# Patient Record
Sex: Male | Born: 1960 | Race: White | Hispanic: No | Marital: Married | Smoking: Current every day smoker
Health system: Southern US, Community
[De-identification: ages and names within clinical notes are randomized; demographics above are authoritative.]

## PROBLEM LIST (undated history)

## (undated) DIAGNOSIS — Z789 Other specified health status: Secondary | ICD-10-CM

## (undated) DIAGNOSIS — Z7289 Other problems related to lifestyle: Secondary | ICD-10-CM

## (undated) HISTORY — PX: OTHER SURGICAL HISTORY: SHX169

---

## 2018-06-19 DIAGNOSIS — R29898 Other symptoms and signs involving the musculoskeletal system: Secondary | ICD-10-CM

## 2018-06-19 DIAGNOSIS — E871 Hypo-osmolality and hyponatremia: Secondary | ICD-10-CM

## 2018-06-19 DIAGNOSIS — M545 Low back pain: Secondary | ICD-10-CM

## 2018-06-19 DIAGNOSIS — R296 Repeated falls: Secondary | ICD-10-CM

## 2018-06-19 DIAGNOSIS — F1010 Alcohol abuse, uncomplicated: Secondary | ICD-10-CM

## 2018-06-19 DIAGNOSIS — G629 Polyneuropathy, unspecified: Secondary | ICD-10-CM

## 2018-06-21 ENCOUNTER — Encounter (HOSPITAL_COMMUNITY): Payer: Self-pay | Admitting: Internal Medicine

## 2018-06-21 ENCOUNTER — Inpatient Hospital Stay (HOSPITAL_COMMUNITY): Payer: Medicare Other

## 2018-06-21 ENCOUNTER — Inpatient Hospital Stay (HOSPITAL_COMMUNITY)
Admission: AD | Admit: 2018-06-21 | Discharge: 2018-07-01 | DRG: 472 | Disposition: A | Discharge status: Rehabilitation Facility | Payer: Medicare Other | Source: Other Acute Inpatient Hospital | Attending: Family Medicine | Admitting: Family Medicine

## 2018-06-21 DIAGNOSIS — Z789 Other specified health status: Secondary | ICD-10-CM

## 2018-06-21 DIAGNOSIS — R062 Wheezing: Secondary | ICD-10-CM

## 2018-06-21 DIAGNOSIS — F17210 Nicotine dependence, cigarettes, uncomplicated: Secondary | ICD-10-CM | POA: Diagnosis present

## 2018-06-21 DIAGNOSIS — R000 Tachycardia, unspecified: Secondary | ICD-10-CM | POA: Diagnosis present

## 2018-06-21 DIAGNOSIS — R279 Unspecified lack of coordination: Secondary | ICD-10-CM | POA: Diagnosis present

## 2018-06-21 DIAGNOSIS — M2578 Osteophyte, vertebrae: Secondary | ICD-10-CM | POA: Diagnosis present

## 2018-06-21 DIAGNOSIS — Z9181 History of falling: Secondary | ICD-10-CM

## 2018-06-21 DIAGNOSIS — J449 Chronic obstructive pulmonary disease, unspecified: Secondary | ICD-10-CM | POA: Diagnosis present

## 2018-06-21 DIAGNOSIS — Z419 Encounter for procedure for purposes other than remedying health state, unspecified: Secondary | ICD-10-CM

## 2018-06-21 DIAGNOSIS — M4802 Spinal stenosis, cervical region: Secondary | ICD-10-CM | POA: Diagnosis present

## 2018-06-21 DIAGNOSIS — K5900 Constipation, unspecified: Secondary | ICD-10-CM | POA: Diagnosis not present

## 2018-06-21 DIAGNOSIS — I10 Essential (primary) hypertension: Secondary | ICD-10-CM | POA: Diagnosis present

## 2018-06-21 DIAGNOSIS — G9520 Unspecified cord compression: Secondary | ICD-10-CM | POA: Diagnosis present

## 2018-06-21 DIAGNOSIS — Z4789 Encounter for other orthopedic aftercare: Secondary | ICD-10-CM | POA: Diagnosis not present

## 2018-06-21 DIAGNOSIS — R9082 White matter disease, unspecified: Secondary | ICD-10-CM | POA: Diagnosis present

## 2018-06-21 DIAGNOSIS — F10239 Alcohol dependence with withdrawal, unspecified: Secondary | ICD-10-CM | POA: Diagnosis not present

## 2018-06-21 DIAGNOSIS — G629 Polyneuropathy, unspecified: Secondary | ICD-10-CM | POA: Diagnosis present

## 2018-06-21 DIAGNOSIS — R2689 Other abnormalities of gait and mobility: Secondary | ICD-10-CM | POA: Diagnosis present

## 2018-06-21 DIAGNOSIS — M48061 Spinal stenosis, lumbar region without neurogenic claudication: Secondary | ICD-10-CM | POA: Diagnosis present

## 2018-06-21 DIAGNOSIS — G8190 Hemiplegia, unspecified affecting unspecified side: Secondary | ICD-10-CM | POA: Diagnosis present

## 2018-06-21 DIAGNOSIS — Z1159 Encounter for screening for other viral diseases: Secondary | ICD-10-CM

## 2018-06-21 DIAGNOSIS — Z23 Encounter for immunization: Secondary | ICD-10-CM | POA: Diagnosis present

## 2018-06-21 DIAGNOSIS — E871 Hypo-osmolality and hyponatremia: Secondary | ICD-10-CM | POA: Diagnosis present

## 2018-06-21 DIAGNOSIS — M5030 Other cervical disc degeneration, unspecified cervical region: Principal | ICD-10-CM | POA: Diagnosis present

## 2018-06-21 DIAGNOSIS — Z720 Tobacco use: Secondary | ICD-10-CM

## 2018-06-21 DIAGNOSIS — F1090 Alcohol use, unspecified, uncomplicated: Secondary | ICD-10-CM

## 2018-06-21 DIAGNOSIS — Z7289 Other problems related to lifestyle: Secondary | ICD-10-CM

## 2018-06-21 HISTORY — DX: Other specified health status: Z78.9

## 2018-06-21 HISTORY — DX: Other problems related to lifestyle: Z72.89

## 2018-06-21 LAB — CBC
HCT: 33.4 % — ABNORMAL LOW (ref 39.0–52.0)
Hemoglobin: 12 g/dL — ABNORMAL LOW (ref 13.0–17.0)
MCH: 36 pg — ABNORMAL HIGH (ref 26.0–34.0)
MCHC: 35.9 g/dL (ref 30.0–36.0)
MCV: 100.3 fL — ABNORMAL HIGH (ref 80.0–100.0)
Platelets: 271 10*3/uL (ref 150–400)
RBC: 3.33 MIL/uL — ABNORMAL LOW (ref 4.22–5.81)
RDW: 15.3 % (ref 11.5–15.5)
WBC: 10.7 10*3/uL — ABNORMAL HIGH (ref 4.0–10.5)
nRBC: 0 % (ref 0.0–0.2)

## 2018-06-21 LAB — BASIC METABOLIC PANEL
Anion gap: 9 (ref 5–15)
BUN: 7 mg/dL (ref 6–20)
CO2: 23 mmol/L (ref 22–32)
Calcium: 8.5 mg/dL — ABNORMAL LOW (ref 8.9–10.3)
Chloride: 101 mmol/L (ref 98–111)
Creatinine, Ser: 0.7 mg/dL (ref 0.61–1.24)
GFR calc Af Amer: >60 mL/min (ref >60)
GFR calc non Af Amer: >60 mL/min (ref >60)
Glucose, Bld: 116 mg/dL — ABNORMAL HIGH (ref 70–99)
Potassium: 3.4 mmol/L — ABNORMAL LOW (ref 3.5–5.1)
Sodium: 133 mmol/L — ABNORMAL LOW (ref 135–145)

## 2018-06-21 LAB — PHOSPHORUS: Phosphorus: 3.3 mg/dL (ref 2.5–4.6)

## 2018-06-21 LAB — MAGNESIUM: Magnesium: 2 mg/dL (ref 1.7–2.4)

## 2018-06-21 MED ORDER — LORAZEPAM 1 MG PO TABS
1.0000 mg | ORAL_TABLET | Freq: Four times a day (QID) | ORAL | Status: AC | PRN
  Administered 2018-06-22 – 2018-06-24 (×4): 1 mg via ORAL
  Filled 2018-06-21 (×4): qty 1

## 2018-06-21 MED ORDER — VITAMIN B-1 100 MG PO TABS
100.0000 mg | ORAL_TABLET | Freq: Every day | ORAL | Status: DC
  Administered 2018-06-21 – 2018-06-29 (×8): 100 mg via ORAL
  Filled 2018-06-21 (×8): qty 1

## 2018-06-21 MED ORDER — NICOTINE 21 MG/24HR TD PT24
21.0000 mg | MEDICATED_PATCH | Freq: Every day | TRANSDERMAL | Status: DC
  Administered 2018-06-21 – 2018-07-01 (×9): 21 mg via TRANSDERMAL
  Filled 2018-06-21 (×10): qty 1

## 2018-06-21 MED ORDER — FOLIC ACID 1 MG PO TABS
1.0000 mg | ORAL_TABLET | Freq: Every day | ORAL | Status: DC
  Administered 2018-06-21 – 2018-06-29 (×8): 1 mg via ORAL
  Filled 2018-06-21 (×8): qty 1

## 2018-06-21 MED ORDER — ACETAMINOPHEN 325 MG PO TABS
650.0000 mg | ORAL_TABLET | Freq: Four times a day (QID) | ORAL | Status: DC | PRN
  Administered 2018-06-22 – 2018-07-01 (×7): 650 mg via ORAL
  Filled 2018-06-21 (×7): qty 2

## 2018-06-21 MED ORDER — THIAMINE HCL 100 MG/ML IJ SOLN: 100.0000 mg | Freq: Every day | INTRAMUSCULAR | Status: DC

## 2018-06-21 MED ORDER — ADULT MULTIVITAMIN W/MINERALS CH
1.0000 | ORAL_TABLET | Freq: Every day | ORAL | Status: DC
  Administered 2018-06-21 – 2018-06-29 (×8): 1 via ORAL
  Filled 2018-06-21 (×8): qty 1

## 2018-06-21 MED ORDER — ONDANSETRON HCL 4 MG PO TABS: 4.0000 mg | ORAL_TABLET | Freq: Four times a day (QID) | ORAL | Status: DC | PRN

## 2018-06-21 MED ORDER — ACETAMINOPHEN 650 MG RE SUPP: 650.0000 mg | Freq: Four times a day (QID) | RECTAL | Status: DC | PRN

## 2018-06-21 MED ORDER — ONDANSETRON HCL 4 MG/2ML IJ SOLN: 4.0000 mg | Freq: Four times a day (QID) | INTRAMUSCULAR | Status: DC | PRN

## 2018-06-21 MED ORDER — ALBUTEROL SULFATE (2.5 MG/3ML) 0.083% IN NEBU
2.5000 mg | INHALATION_SOLUTION | RESPIRATORY_TRACT | Status: DC | PRN
  Administered 2018-06-22: 12:00:00 2.5 mg via RESPIRATORY_TRACT
  Filled 2018-06-21 (×2): qty 3

## 2018-06-21 MED ORDER — LORAZEPAM 2 MG/ML IJ SOLN
1.0000 mg | Freq: Four times a day (QID) | INTRAMUSCULAR | Status: AC | PRN
  Administered 2018-06-21 – 2018-06-24 (×7): 1 mg via INTRAVENOUS
  Filled 2018-06-21 (×7): qty 1
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
## 2018-06-22 LAB — HIV ANTIBODY (ROUTINE TESTING W REFLEX): HIV Screen 4th Generation wRfx: NONREACTIVE

## 2018-06-22 LAB — BASIC METABOLIC PANEL
Anion gap: 8 (ref 5–15)
BUN: 6 mg/dL (ref 6–20)
CO2: 25 mmol/L (ref 22–32)
Calcium: 8.7 mg/dL — ABNORMAL LOW (ref 8.9–10.3)
Chloride: 102 mmol/L (ref 98–111)
Creatinine, Ser: 0.79 mg/dL (ref 0.61–1.24)
GFR calc Af Amer: >60 mL/min (ref >60)
GFR calc non Af Amer: >60 mL/min (ref >60)
Glucose, Bld: 115 mg/dL — ABNORMAL HIGH (ref 70–99)
Potassium: 3.6 mmol/L (ref 3.5–5.1)
Sodium: 135 mmol/L (ref 135–145)

## 2018-06-22 LAB — CBC
HCT: 35.3 % — ABNORMAL LOW (ref 39.0–52.0)
Hemoglobin: 12.4 g/dL — ABNORMAL LOW (ref 13.0–17.0)
MCH: 35.7 pg — ABNORMAL HIGH (ref 26.0–34.0)
MCHC: 35.1 g/dL (ref 30.0–36.0)
MCV: 101.7 fL — ABNORMAL HIGH (ref 80.0–100.0)
Platelets: 273 10*3/uL (ref 150–400)
RBC: 3.47 MIL/uL — ABNORMAL LOW (ref 4.22–5.81)
RDW: 15.4 % (ref 11.5–15.5)
WBC: 9 10*3/uL (ref 4.0–10.5)
nRBC: 0 % (ref 0.0–0.2)

## 2018-06-22 LAB — SARS CORONAVIRUS 2: SARS Coronavirus 2: NOT DETECTED

## 2018-06-22 MED ORDER — POTASSIUM CHLORIDE 20 MEQ/15ML (10%) PO SOLN
40.0000 meq | Freq: Once | ORAL | Status: AC
  Administered 2018-06-22: 40 meq via ORAL
  Filled 2018-06-22: qty 30

## 2018-06-22 NOTE — Plan of Care (Signed)
  Problem: Elimination: Goal: Will not experience complications related to bowel motility Outcome: Progressing   
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
## 2018-06-23 NOTE — Plan of Care (Signed)
  Problem: Nutrition: Goal: Adequate nutrition will be maintained Outcome: Progressing   

## 2018-06-23 NOTE — Progress Notes (Signed)
  NEUROSURGERY PROGRESS NOTE   No issues overnight.  Remains somnolent  EXAM:  BP (!) 186/93 (BP Location: Right Arm)   Pulse 93   Temp 97.7 F (36.5 C) (Axillary)   Resp 17   SpO2 98%   somnolent difficult to arouse secondary to sedation  IMPRESSION/PLAN 58 y.o. male with mod-severe C5-6 spinal stenosis needing decompression. Remains somnolent due to sedating meds. Will await recovery from w/d prior to surgery. Hopefully later this week.
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
## 2018-06-24 LAB — COMPREHENSIVE METABOLIC PANEL
ALT: 38 U/L (ref 0–44)
AST: 28 U/L (ref 15–41)
Albumin: 3.2 g/dL — ABNORMAL LOW (ref 3.5–5.0)
Alkaline Phosphatase: 56 U/L (ref 38–126)
Anion gap: 10 (ref 5–15)
BUN: 6 mg/dL (ref 6–20)
CO2: 33 mmol/L — ABNORMAL HIGH (ref 22–32)
Calcium: 8.8 mg/dL — ABNORMAL LOW (ref 8.9–10.3)
Chloride: 93 mmol/L — ABNORMAL LOW (ref 98–111)
Creatinine, Ser: 0.73 mg/dL (ref 0.61–1.24)
GFR calc Af Amer: >60 mL/min (ref >60)
GFR calc non Af Amer: >60 mL/min (ref >60)
Glucose, Bld: 93 mg/dL (ref 70–99)
Potassium: 2.8 mmol/L — ABNORMAL LOW (ref 3.5–5.1)
Sodium: 136 mmol/L (ref 135–145)
Total Bilirubin: 1 mg/dL (ref 0.3–1.2)
Total Protein: 5.6 g/dL — ABNORMAL LOW (ref 6.5–8.1)

## 2018-06-24 LAB — CBC WITH DIFFERENTIAL/PLATELET
Abs Immature Granulocytes: 0.08 10*3/uL — ABNORMAL HIGH (ref 0.00–0.07)
Basophils Absolute: 0 10*3/uL (ref 0.0–0.1)
Basophils Relative: 0 %
Eosinophils Absolute: 0.1 10*3/uL (ref 0.0–0.5)
Eosinophils Relative: 1 %
HCT: 37.5 % — ABNORMAL LOW (ref 39.0–52.0)
Hemoglobin: 13.7 g/dL (ref 13.0–17.0)
Immature Granulocytes: 1 %
Lymphocytes Relative: 10 %
Lymphs Abs: 0.9 10*3/uL (ref 0.7–4.0)
MCH: 36.1 pg — ABNORMAL HIGH (ref 26.0–34.0)
MCHC: 36.5 g/dL — ABNORMAL HIGH (ref 30.0–36.0)
MCV: 98.7 fL (ref 80.0–100.0)
Monocytes Absolute: 0.7 10*3/uL (ref 0.1–1.0)
Monocytes Relative: 8 %
Neutro Abs: 7.5 10*3/uL (ref 1.7–7.7)
Neutrophils Relative %: 80 %
Platelets: 200 10*3/uL (ref 150–400)
RBC: 3.8 MIL/uL — ABNORMAL LOW (ref 4.22–5.81)
RDW: 14.9 % (ref 11.5–15.5)
WBC: 9.3 10*3/uL (ref 4.0–10.5)
nRBC: 0 % (ref 0.0–0.2)

## 2018-06-24 LAB — AMMONIA: Ammonia: 20 umol/L (ref 9–35)

## 2018-06-24 MED ORDER — POTASSIUM CHLORIDE CRYS ER 20 MEQ PO TBCR
40.0000 meq | EXTENDED_RELEASE_TABLET | Freq: Four times a day (QID) | ORAL | Status: AC
  Administered 2018-06-24 (×2): 40 meq via ORAL
  Filled 2018-06-24 (×2): qty 2
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
## 2018-06-24 NOTE — Care Management Important Message (Signed)
Important Message  Patient Details  Name: Curtis Bowen MRN: [id number] Date of Birth: 11-25-1960   Medicare Important Message Given:  Yes    Chandan Fly Montine Circle 06/24/2018, 1:39 PM

## 2018-06-24 NOTE — Progress Notes (Signed)
  NEUROSURGERY PROGRESS NOTE   No issues overnight.  More awake, but confused  EXAM:  BP (!) 169/82 (BP Location: Right Arm)   Pulse 80   Temp 97.8 F (36.6 C) (Oral)   Resp 18   SpO2 99%   Awake, alert Oriented to self not location/year/situation Intermittently follows commands Does wiggle b/l feet to command Unable to perform strength testing   PLAN More alert but remains confused. Awaiting recovery from w/d prior to surgery.

## 2018-06-24 NOTE — Plan of Care (Signed)
  Problem: Education: Goal: Knowledge of General Education information will improve Description: Including pain rating scale, medication(s)/side effects and non-pharmacologic comfort measures Outcome: Progressing   Problem: Health Behavior/Discharge Planning: Goal: Ability to manage health-related needs will improve Outcome: Progressing   Problem: Clinical Measurements: Goal: Ability to maintain clinical measurements within normal limits will improve Outcome: Progressing Goal: Will remain free from infection Outcome: Progressing Goal: Diagnostic test results will improve Outcome: Progressing Goal: Respiratory complications will improve Outcome: Progressing Goal: Cardiovascular complication will be avoided Outcome: Progressing   Problem: Activity: Goal: Risk for activity intolerance will decrease Outcome: Progressing   Problem: Nutrition: Goal: Adequate nutrition will be maintained Outcome: Progressing   Problem: Coping: Goal: Level of anxiety will decrease Outcome: Progressing   Problem: Elimination: Goal: Will not experience complications related to bowel motility Outcome: Progressing Goal: Will not experience complications related to urinary retention Outcome: Progressing   Problem: Pain Managment: Goal: General experience of comfort will improve Outcome: Progressing   Problem: Safety: Goal: Ability to remain free from injury will improve Outcome: Progressing   Problem: Skin Integrity: Goal: Risk for impaired skin integrity will decrease Outcome: Progressing   Problem: Education: Goal: Knowledge of disease or condition will improve Outcome: Progressing Goal: Understanding of discharge needs will improve Outcome: Progressing   Problem: Health Behavior/Discharge Planning: Goal: Ability to identify changes in lifestyle to reduce recurrence of condition will improve Outcome: Progressing Goal: Identification of resources available to assist in meeting health  care needs will improve Outcome: Progressing   Problem: Physical Regulation: Goal: Complications related to the disease process, condition or treatment will be avoided or minimized Outcome: Progressing   Problem: Safety: Goal: Ability to remain free from injury will improve Outcome: Progressing   Wes Lezotte, BSN, RN 

## 2018-06-25 ENCOUNTER — Other Ambulatory Visit: Payer: Self-pay | Admitting: Neurosurgery

## 2018-06-25 ENCOUNTER — Encounter (HOSPITAL_COMMUNITY): Payer: Self-pay | Admitting: Anesthesiology

## 2018-06-25 LAB — BASIC METABOLIC PANEL
Anion gap: 11 (ref 5–15)
BUN: 7 mg/dL (ref 6–20)
CO2: 29 mmol/L (ref 22–32)
Calcium: 8.5 mg/dL — ABNORMAL LOW (ref 8.9–10.3)
Chloride: 96 mmol/L — ABNORMAL LOW (ref 98–111)
Creatinine, Ser: 0.68 mg/dL (ref 0.61–1.24)
GFR calc Af Amer: >60 mL/min (ref >60)
GFR calc non Af Amer: >60 mL/min (ref >60)
Glucose, Bld: 96 mg/dL (ref 70–99)
Potassium: 3.5 mmol/L (ref 3.5–5.1)
Sodium: 136 mmol/L (ref 135–145)

## 2018-06-25 LAB — TYPE AND SCREEN
ABO/RH(D): AB NEG
Antibody Screen: NEGATIVE

## 2018-06-25 LAB — ABO/RH: ABO/RH(D): AB NEG

## 2018-06-25 MED ORDER — HYDRALAZINE HCL 25 MG PO TABS
25.0000 mg | ORAL_TABLET | Freq: Four times a day (QID) | ORAL | Status: DC
  Administered 2018-06-25 – 2018-07-01 (×22): 25 mg via ORAL
  Filled 2018-06-25 (×21): qty 1

## 2018-06-25 MED ORDER — METOPROLOL TARTRATE 25 MG PO TABS
25.0000 mg | ORAL_TABLET | Freq: Two times a day (BID) | ORAL | Status: DC
  Administered 2018-06-25 – 2018-07-01 (×12): 25 mg via ORAL
  Filled 2018-06-25 (×12): qty 1

## 2018-06-25 NOTE — Anesthesia Preprocedure Evaluation (Addendum)
Anesthesia Evaluation  Patient identified by MRN, date of birth, ID band Patient awake    Reviewed: Allergy & Precautions, NPO status , Patient's Chart, lab work & pertinent test results  Airway Mallampati: I       Dental no notable dental hx. (+) Teeth Intact   Pulmonary Current Smoker,    Pulmonary exam normal breath sounds clear to auscultation       Cardiovascular negative cardio ROS Normal cardiovascular exam Rhythm:Regular Rate:Normal     Neuro/Psych    GI/Hepatic negative GI ROS, Neg liver ROS,   Endo/Other  negative endocrine ROS  Renal/GU negative Renal ROS     Musculoskeletal negative musculoskeletal ROS (+)   Abdominal Normal abdominal exam  (+)   Peds  Hematology negative hematology ROS (+)   Anesthesia Other Findings   Reproductive/Obstetrics                            Anesthesia Physical Anesthesia Plan  ASA: II  Anesthesia Plan: General   Post-op Pain Management:    Induction: Intravenous  PONV Risk Score and Plan: 2 and Ondansetron and Dexamethasone  Airway Management Planned: Oral ETT  Additional Equipment:   Intra-op Plan:   Post-operative Plan: Extubation in OR  Informed Consent: I have reviewed the patients History and Physical, chart, labs and discussed the procedure including the risks, benefits and alternatives for the proposed anesthesia with the patient or authorized representative who has indicated his/her understanding and acceptance.     Dental advisory given  Plan Discussed with: CRNA  Anesthesia Plan Comments:        Anesthesia Quick Evaluation

## 2018-06-25 NOTE — TOC Initial Note (Signed)
Transition of Care [facility]) - Initial/Assessment Note    Patient Details  Name: Curtis Bowen MRN: [id number] Date of Birth: October 07, 1960  [facility]) CM/SW Contact:    Pollie Friar, RN Phone Number: 06/25/2018, 1:43 PM  Clinical Narrative:                 PCP: Dr Micheal Likens  Pt on CIWA and waiting to have cervical surgery. Will need PT/OT to see post op for needs.  TOC following.  Expected Discharge Plan: Home/Self Care Barriers to Discharge: Continued Medical Work up   Patient Goals and CMS Choice        Expected Discharge Plan and Services Expected Discharge Plan: Home/Self Care   Discharge Planning Services: CM Consult   Living arrangements for the past 2 months: Mobile Home(about 15 steps to enter)                                      Prior Living Arrangements/Services Living arrangements for the past 2 months: Mobile Home(about 15 steps to enter) Lives with:: Spouse Patient language and need for interpreter reviewed:: Yes(no needs) Do you feel safe going back to the place where you live?: Yes        Care giver support system in place?: (pts wife works but he states she may be able to take time off if needed) Current home services: DME(cane, walker, shower seat) Criminal Activity/Legal Involvement Pertinent to Current Situation/Hospitalization: No - Comment as needed  Activities of Daily Living      Permission Sought/Granted                  Emotional Assessment Appearance:: Appears older than stated age Attitude/Demeanor/Rapport: Engaged Affect (typically observed): Accepting Orientation: : Oriented to Self, Oriented to Place   Psych Involvement: No (comment)  Admission diagnosis:  Severe Cervical Stinosis Patient Active Problem List   Diagnosis Date Noted  . Cervical spinal stenosis 06/21/2018  . Alcohol use   . Hyponatremia   . Tobacco use   . Wheezing    PCP:  System, Pcp Not In Pharmacy:   [place], [place] [address] Phone: [phone] Fax: [phone]     Social Determinants of Health (SDOH) Interventions    Readmission Risk Interventions No flowsheet data found.

## 2018-06-25 NOTE — Progress Notes (Addendum)
  NEUROSURGERY PROGRESS NOTE   No issues overnight.  No ativan overnight Complains of weakness and N/T in BLE  EXAM:  BP (!) 161/81 (BP Location: Right Arm)   Pulse 86   Temp 98.1 F (36.7 C) (Oral)   Resp 16   SpO2 100%   Awake, alert Oriented to self CN grossly intact  Intermittently follows commands Weakness BLE R>L  PLAN Continues to improve mentally although still mildly confused Will discuss with medicine regarding readiness for surgery

## 2018-06-25 NOTE — Plan of Care (Signed)
  Problem: Education: Goal: Knowledge of General Education information will improve Description: Including pain rating scale, medication(s)/side effects and non-pharmacologic comfort measures Outcome: Progressing   Problem: Health Behavior/Discharge Planning: Goal: Ability to manage health-related needs will improve Outcome: Progressing   Problem: Clinical Measurements: Goal: Ability to maintain clinical measurements within normal limits will improve Outcome: Progressing Goal: Will remain free from infection Outcome: Progressing Goal: Diagnostic test results will improve Outcome: Progressing Goal: Respiratory complications will improve Outcome: Progressing Goal: Cardiovascular complication will be avoided Outcome: Progressing   Problem: Activity: Goal: Risk for activity intolerance will decrease Outcome: Progressing   Problem: Nutrition: Goal: Adequate nutrition will be maintained Outcome: Progressing   Problem: Coping: Goal: Level of anxiety will decrease Outcome: Progressing   Problem: Elimination: Goal: Will not experience complications related to bowel motility Outcome: Progressing Goal: Will not experience complications related to urinary retention Outcome: Progressing   Problem: Pain Managment: Goal: General experience of comfort will improve Outcome: Progressing   Problem: Safety: Goal: Ability to remain free from injury will improve Outcome: Progressing   Problem: Skin Integrity: Goal: Risk for impaired skin integrity will decrease Outcome: Progressing   Problem: Education: Goal: Knowledge of disease or condition will improve Outcome: Progressing Goal: Understanding of discharge needs will improve Outcome: Progressing   Problem: Health Behavior/Discharge Planning: Goal: Ability to identify changes in lifestyle to reduce recurrence of condition will improve Outcome: Progressing Goal: Identification of resources available to assist in meeting health  care needs will improve Outcome: Progressing   Problem: Physical Regulation: Goal: Complications related to the disease process, condition or treatment will be avoided or minimized Outcome: Progressing   Problem: Safety: Goal: Ability to remain free from injury will improve Outcome: Progressing   Curtis Bowen, BSN, RN 
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
## 2018-06-25 NOTE — Plan of Care (Signed)
  Problem: Education: Goal: Knowledge of General Education information will improve Description: Including pain rating scale, medication(s)/side effects and non-pharmacologic comfort measures Outcome: Progressing   Problem: Health Behavior/Discharge Planning: Goal: Ability to manage health-related needs will improve Outcome: Progressing   Problem: Clinical Measurements: Goal: Ability to maintain clinical measurements within normal limits will improve Outcome: Progressing Goal: Will remain free from infection Outcome: Progressing Goal: Diagnostic test results will improve Outcome: Progressing Goal: Respiratory complications will improve Outcome: Progressing Goal: Cardiovascular complication will be avoided Outcome: Progressing   Problem: Activity: Goal: Risk for activity intolerance will decrease Outcome: Progressing   Problem: Nutrition: Goal: Adequate nutrition will be maintained Outcome: Progressing   Problem: Coping: Goal: Level of anxiety will decrease Outcome: Progressing   Problem: Elimination: Goal: Will not experience complications related to bowel motility Outcome: Progressing Goal: Will not experience complications related to urinary retention Outcome: Progressing   Problem: Pain Managment: Goal: General experience of comfort will improve Outcome: Progressing   Problem: Safety: Goal: Ability to remain free from injury will improve Outcome: Progressing   Problem: Skin Integrity: Goal: Risk for impaired skin integrity will decrease Outcome: Progressing   Problem: Education: Goal: Knowledge of disease or condition will improve Outcome: Progressing Goal: Understanding of discharge needs will improve Outcome: Progressing   Problem: Health Behavior/Discharge Planning: Goal: Ability to identify changes in lifestyle to reduce recurrence of condition will improve Outcome: Progressing Goal: Identification of resources available to assist in meeting health  care needs will improve Outcome: Progressing   Problem: Physical Regulation: Goal: Complications related to the disease process, condition or treatment will be avoided or minimized Outcome: Progressing   Problem: Safety: Goal: Ability to remain free from injury will improve Outcome: Progressing   Ival Bible, BSN, RN

## 2018-06-26 ENCOUNTER — Inpatient Hospital Stay (HOSPITAL_COMMUNITY): Payer: Medicare Other

## 2018-06-26 ENCOUNTER — Inpatient Hospital Stay (HOSPITAL_COMMUNITY): Payer: Medicare Other | Admitting: Certified Registered"

## 2018-06-26 ENCOUNTER — Encounter (HOSPITAL_COMMUNITY)
Admission: AD | Disposition: A | Discharge status: Rehabilitation Facility | Payer: Self-pay | Source: Other Acute Inpatient Hospital | Attending: Internal Medicine

## 2018-06-26 HISTORY — PX: ANTERIOR CERVICAL DECOMP/DISCECTOMY FUSION: SHX1161

## 2018-06-26 SURGERY — ANTERIOR CERVICAL DECOMPRESSION/DISCECTOMY FUSION 1 LEVEL
Anesthesia: General | Site: Spine Cervical

## 2018-06-26 MED ORDER — MEPERIDINE HCL 25 MG/ML IJ SOLN: 6.2500 mg | INTRAMUSCULAR | Status: DC | PRN

## 2018-06-26 MED ORDER — FENTANYL CITRATE (PF) 250 MCG/5ML IJ SOLN
INTRAMUSCULAR | Status: DC | PRN
  Administered 2018-06-26: 25 ug via INTRAVENOUS
  Administered 2018-06-26: 100 ug via INTRAVENOUS

## 2018-06-26 MED ORDER — KETOROLAC TROMETHAMINE 30 MG/ML IJ SOLN: 30.0000 mg | Freq: Once | INTRAMUSCULAR | Status: DC | PRN

## 2018-06-26 MED ORDER — DEXAMETHASONE SODIUM PHOSPHATE 10 MG/ML IJ SOLN
INTRAMUSCULAR | Status: DC | PRN
  Administered 2018-06-26: 10 mg via INTRAVENOUS

## 2018-06-26 MED ORDER — LACTATED RINGERS IV SOLN
INTRAVENOUS | Status: DC | PRN
  Administered 2018-06-26 (×2): via INTRAVENOUS

## 2018-06-26 MED ORDER — CHLORHEXIDINE GLUCONATE CLOTH 2 % EX PADS
6.0000 | MEDICATED_PAD | Freq: Once | CUTANEOUS | Status: AC
  Administered 2018-06-26: 6 via TOPICAL

## 2018-06-26 MED ORDER — ROCURONIUM BROMIDE 10 MG/ML (PF) SYRINGE
PREFILLED_SYRINGE | INTRAVENOUS | Status: DC | PRN
  Administered 2018-06-26: 50 mg via INTRAVENOUS

## 2018-06-26 MED ORDER — 0.9 % SODIUM CHLORIDE (POUR BTL) OPTIME
TOPICAL | Status: DC | PRN
  Administered 2018-06-26: 1000 mL

## 2018-06-26 MED ORDER — CEFAZOLIN SODIUM-DEXTROSE 2-4 GM/100ML-% IV SOLN
2.0000 g | INTRAVENOUS | Status: AC
  Administered 2018-06-26: 2 g via INTRAVENOUS
  Filled 2018-06-26 (×2): qty 100

## 2018-06-26 MED ORDER — LIDOCAINE 2% (20 MG/ML) 5 ML SYRINGE
INTRAMUSCULAR | Status: AC
  Filled 2018-06-26: qty 10

## 2018-06-26 MED ORDER — MIDAZOLAM HCL 2 MG/2ML IJ SOLN
INTRAMUSCULAR | Status: AC
  Filled 2018-06-26: qty 2

## 2018-06-26 MED ORDER — SODIUM CHLORIDE 0.9 % IV SOLN
INTRAVENOUS | Status: DC | PRN
  Administered 2018-06-26: 13:00:00 500 mL

## 2018-06-26 MED ORDER — BUPIVACAINE HCL 0.5 % IJ SOLN
INTRAMUSCULAR | Status: DC | PRN
  Administered 2018-06-26: 3.5 mL

## 2018-06-26 MED ORDER — FENTANYL CITRATE (PF) 250 MCG/5ML IJ SOLN
INTRAMUSCULAR | Status: AC
  Filled 2018-06-26: qty 5

## 2018-06-26 MED ORDER — ONDANSETRON HCL 4 MG/2ML IJ SOLN
INTRAMUSCULAR | Status: DC | PRN
  Administered 2018-06-26: 4 mg via INTRAVENOUS

## 2018-06-26 MED ORDER — THROMBIN 5000 UNITS EX SOLR
CUTANEOUS | Status: AC
  Filled 2018-06-26: qty 15000

## 2018-06-26 MED ORDER — HYDROMORPHONE HCL 1 MG/ML IJ SOLN
INTRAMUSCULAR | Status: AC
  Filled 2018-06-26: qty 1

## 2018-06-26 MED ORDER — OXYCODONE-ACETAMINOPHEN 5-325 MG PO TABS
1.0000 | ORAL_TABLET | Freq: Four times a day (QID) | ORAL | Status: AC | PRN
  Administered 2018-06-26 – 2018-06-28 (×2): 1 via ORAL
  Filled 2018-06-26 (×3): qty 1

## 2018-06-26 MED ORDER — BUPIVACAINE HCL (PF) 0.5 % IJ SOLN
INTRAMUSCULAR | Status: AC
  Filled 2018-06-26: qty 30

## 2018-06-26 MED ORDER — SUCCINYLCHOLINE CHLORIDE 200 MG/10ML IV SOSY
PREFILLED_SYRINGE | INTRAVENOUS | Status: AC
  Filled 2018-06-26: qty 20

## 2018-06-26 MED ORDER — SUGAMMADEX SODIUM 200 MG/2ML IV SOLN
INTRAVENOUS | Status: DC | PRN
  Administered 2018-06-26: 200 mg via INTRAVENOUS

## 2018-06-26 MED ORDER — SODIUM CHLORIDE 0.9 % IV SOLN
INTRAVENOUS | Status: DC | PRN
  Administered 2018-06-26: 11:00:00 25 ug/min via INTRAVENOUS

## 2018-06-26 MED ORDER — ROCURONIUM BROMIDE 10 MG/ML (PF) SYRINGE
PREFILLED_SYRINGE | INTRAVENOUS | Status: AC
  Filled 2018-06-26: qty 20

## 2018-06-26 MED ORDER — LIDOCAINE 2% (20 MG/ML) 5 ML SYRINGE
INTRAMUSCULAR | Status: DC | PRN
  Administered 2018-06-26: 100 mg via INTRAVENOUS

## 2018-06-26 MED ORDER — HYDROMORPHONE HCL 1 MG/ML IJ SOLN
0.2500 mg | INTRAMUSCULAR | Status: DC | PRN
  Administered 2018-06-26: 0.5 mg via INTRAVENOUS

## 2018-06-26 MED ORDER — DEXAMETHASONE SODIUM PHOSPHATE 10 MG/ML IJ SOLN
INTRAMUSCULAR | Status: AC
  Filled 2018-06-26: qty 2

## 2018-06-26 MED ORDER — LIDOCAINE-EPINEPHRINE 1 %-1:100000 IJ SOLN
INTRAMUSCULAR | Status: AC
  Filled 2018-06-26: qty 1

## 2018-06-26 MED ORDER — HYDROMORPHONE HCL 1 MG/ML IJ SOLN: 0.2500 mg | INTRAMUSCULAR | Status: DC | PRN

## 2018-06-26 MED ORDER — PHENYLEPHRINE HCL (PRESSORS) 10 MG/ML IV SOLN
INTRAVENOUS | Status: AC
  Filled 2018-06-26: qty 2

## 2018-06-26 MED ORDER — ONDANSETRON HCL 4 MG/2ML IJ SOLN
INTRAMUSCULAR | Status: AC
  Filled 2018-06-26: qty 4

## 2018-06-26 MED ORDER — PROPOFOL 10 MG/ML IV BOLUS
INTRAVENOUS | Status: AC
  Filled 2018-06-26: qty 20

## 2018-06-26 MED ORDER — SUCCINYLCHOLINE CHLORIDE 20 MG/ML IJ SOLN
INTRAMUSCULAR | Status: DC | PRN
  Administered 2018-06-26: 100 mg via INTRAVENOUS

## 2018-06-26 MED ORDER — THROMBIN 5000 UNITS EX SOLR
OROMUCOSAL | Status: DC | PRN
  Administered 2018-06-26: 13:00:00 5 mL via TOPICAL

## 2018-06-26 MED ORDER — PROMETHAZINE HCL 25 MG/ML IJ SOLN: 6.2500 mg | INTRAMUSCULAR | Status: DC | PRN

## 2018-06-26 MED ORDER — LIDOCAINE-EPINEPHRINE 1 %-1:100000 IJ SOLN
INTRAMUSCULAR | Status: DC | PRN
  Administered 2018-06-26: 3.6 mL

## 2018-06-26 MED ORDER — PROPOFOL 10 MG/ML IV BOLUS
INTRAVENOUS | Status: DC | PRN
  Administered 2018-06-26: 140 mg via INTRAVENOUS

## 2018-06-26 MED ORDER — THROMBIN 5000 UNITS EX SOLR
CUTANEOUS | Status: AC
  Filled 2018-06-26: qty 5000

## 2018-06-26 MED ORDER — EPHEDRINE SULFATE-NACL 50-0.9 MG/10ML-% IV SOSY
PREFILLED_SYRINGE | INTRAVENOUS | Status: DC | PRN
  Administered 2018-06-26: 5 mg via INTRAVENOUS

## 2018-06-26 MED ORDER — PHENYLEPHRINE 40 MCG/ML (10ML) SYRINGE FOR IV PUSH (FOR BLOOD PRESSURE SUPPORT)
PREFILLED_SYRINGE | INTRAVENOUS | Status: DC | PRN
  Administered 2018-06-26 (×2): 80 ug via INTRAVENOUS
  Administered 2018-06-26: 120 ug via INTRAVENOUS

## 2018-06-26 SURGICAL SUPPLY — 69 items
BAG DECANTER FOR FLEXI CONT (MISCELLANEOUS) ×3 IMPLANT
BAG URINE DRAINAGE (UROLOGICAL SUPPLIES) ×3 IMPLANT
BENZOIN TINCTURE PRP APPL 2/3 (GAUZE/BANDAGES/DRESSINGS) IMPLANT
BLADE CLIPPER SURG (BLADE) ×3 IMPLANT
BLADE SURG 11 STRL SS (BLADE) ×3 IMPLANT
BLADE ULTRA TIP 2M (BLADE) IMPLANT
BUR MATCHSTICK NEURO 3.0 LAGG (BURR) ×3 IMPLANT
CANISTER SUCT 3000ML PPV (MISCELLANEOUS) ×3 IMPLANT
CARTRIDGE OIL MAESTRO DRILL (MISCELLANEOUS) ×1 IMPLANT
CLOSURE WOUND 1/2 X4 (GAUZE/BANDAGES/DRESSINGS)
COVER WAND RF STERILE (DRAPES) IMPLANT
DECANTER SPIKE VIAL GLASS SM (MISCELLANEOUS) ×3 IMPLANT
DERMABOND ADVANCED (GAUZE/BANDAGES/DRESSINGS) ×2
DERMABOND ADVANCED .7 DNX12 (GAUZE/BANDAGES/DRESSINGS) ×1 IMPLANT
DEVICE ENDSKLTN TC NANOLCK 6MM (Cage) ×1 IMPLANT
DIFFUSER DRILL AIR PNEUMATIC (MISCELLANEOUS) ×3 IMPLANT
DRAPE C-ARM 42X72 X-RAY (DRAPES) ×6 IMPLANT
DRAPE HALF SHEET 40X57 (DRAPES) IMPLANT
DRAPE LAPAROTOMY 100X72 PEDS (DRAPES) ×3 IMPLANT
DRAPE MICROSCOPE LEICA (MISCELLANEOUS) ×3 IMPLANT
DRSG OPSITE 4X5.5 SM (GAUZE/BANDAGES/DRESSINGS) ×6 IMPLANT
DRSG OPSITE POSTOP 4X6 (GAUZE/BANDAGES/DRESSINGS) ×3 IMPLANT
DURAPREP 6ML APPLICATOR 50/CS (WOUND CARE) ×3 IMPLANT
ELECT COATED BLADE 2.86 ST (ELECTRODE) ×6 IMPLANT
ELECT REM PT RETURN 9FT ADLT (ELECTROSURGICAL) ×3
ELECTRODE REM PT RTRN 9FT ADLT (ELECTROSURGICAL) ×1 IMPLANT
ENDOSKELETON TC NANOLOCK 6MM (Cage) ×3 IMPLANT
GAUZE 4X4 16PLY RFD (DISPOSABLE) IMPLANT
GLOVE BIO SURGEON STRL SZ 6.5 (GLOVE) ×6 IMPLANT
GLOVE BIO SURGEON STRL SZ7.5 (GLOVE) ×9 IMPLANT
GLOVE BIO SURGEONS STRL SZ 6.5 (GLOVE) ×3
GLOVE BIOGEL PI IND STRL 6.5 (GLOVE) ×2 IMPLANT
GLOVE BIOGEL PI IND STRL 7.0 (GLOVE) ×1 IMPLANT
GLOVE BIOGEL PI IND STRL 7.5 (GLOVE) ×3 IMPLANT
GLOVE BIOGEL PI INDICATOR 6.5 (GLOVE) ×4
GLOVE BIOGEL PI INDICATOR 7.0 (GLOVE) ×2
GLOVE BIOGEL PI INDICATOR 7.5 (GLOVE) ×6
GLOVE ECLIPSE 7.0 STRL STRAW (GLOVE) ×6 IMPLANT
GLOVE EXAM NITRILE XL STR (GLOVE) IMPLANT
GLOVE SURG SS PI 6.0 STRL IVOR (GLOVE) ×6 IMPLANT
GOWN STRL REUS W/ TWL LRG LVL3 (GOWN DISPOSABLE) ×5 IMPLANT
GOWN STRL REUS W/ TWL XL LVL3 (GOWN DISPOSABLE) IMPLANT
GOWN STRL REUS W/TWL 2XL LVL3 (GOWN DISPOSABLE) IMPLANT
GOWN STRL REUS W/TWL LRG LVL3 (GOWN DISPOSABLE) ×10
GOWN STRL REUS W/TWL XL LVL3 (GOWN DISPOSABLE)
HEMOSTAT POWDER KIT SURGIFOAM (HEMOSTASIS) ×3 IMPLANT
KIT BASIN OR (CUSTOM PROCEDURE TRAY) ×3 IMPLANT
KIT TURNOVER KIT B (KITS) ×3 IMPLANT
MARKER SKIN DUAL TIP RULER LAB (MISCELLANEOUS) ×3 IMPLANT
NEEDLE HYPO 22GX1.5 SAFETY (NEEDLE) ×3 IMPLANT
NEEDLE SPNL 22GX3.5 QUINCKE BK (NEEDLE) ×3 IMPLANT
NS IRRIG 1000ML POUR BTL (IV SOLUTION) ×3 IMPLANT
OIL CARTRIDGE MAESTRO DRILL (MISCELLANEOUS) ×3
PACK LAMINECTOMY NEURO (CUSTOM PROCEDURE TRAY) ×3 IMPLANT
PAD ARMBOARD 7.5X6 YLW CONV (MISCELLANEOUS) ×9 IMPLANT
PLATE ZEVO 1LVL 19MM (Plate) ×3 IMPLANT
PUTTY DBF 1CC CORTICAL FIBERS (Putty) ×3 IMPLANT
RUBBERBAND STERILE (MISCELLANEOUS) ×6 IMPLANT
SCREW 3.5 SELFDRILL 15MM VARI (Screw) ×12 IMPLANT
SPONGE INTESTINAL PEANUT (DISPOSABLE) ×3 IMPLANT
SPONGE SURGIFOAM ABS GEL SZ50 (HEMOSTASIS) IMPLANT
STAPLER VISISTAT 35W (STAPLE) ×3 IMPLANT
STRIP CLOSURE SKIN 1/2X4 (GAUZE/BANDAGES/DRESSINGS) IMPLANT
SUT VIC AB 3-0 SH 8-18 (SUTURE) ×3 IMPLANT
SUT VICRYL 3-0 RB1 18 ABS (SUTURE) ×9 IMPLANT
TAPE CLOTH 3X10 TAN LF (GAUZE/BANDAGES/DRESSINGS) ×3 IMPLANT
TOWEL GREEN STERILE (TOWEL DISPOSABLE) ×3 IMPLANT
TOWEL GREEN STERILE FF (TOWEL DISPOSABLE) ×3 IMPLANT
WATER STERILE IRR 1000ML POUR (IV SOLUTION) ×3 IMPLANT

## 2018-06-26 NOTE — Progress Notes (Signed)
Dr. Kennon Holter informed of pt's need for signed consent for possible surgery 06/26/2018.

## 2018-06-26 NOTE — Anesthesia Procedure Notes (Signed)
Procedure Name: Intubation Date/Time: 06/26/2018 11:01 AM Performed by: Gaylene Brooks, CRNA Pre-anesthesia Checklist: Patient identified, Emergency Drugs available, Suction available and Patient being monitored Patient Re-evaluated:Patient Re-evaluated prior to induction Oxygen Delivery Method: Circle System Utilized Preoxygenation: Pre-oxygenation with 100% oxygen Induction Type: IV induction Laryngoscope Size: Glidescope and 3 Grade View: Grade I Tube type: Oral Tube size: 7.5 mm Number of attempts: 1 Airway Equipment and Method: Stylet and Oral airway Placement Confirmation: ETT inserted through vocal cords under direct vision,  positive ETCO2 and breath sounds checked- equal and bilateral Secured at: 23 cm Tube secured with: Tape Dental Injury: Teeth and Oropharynx as per pre-operative assessment  Comments: Glidescope used at surgeon request.  Neck maintained in neutral position during induction and intubation.

## 2018-06-26 NOTE — Progress Notes (Signed)
  NEUROSURGERY PROGRESS NOTE   No issues overnight.   EXAM:  BP (!) 159/75 (BP Location: Right Arm)   Pulse 71   Temp 98.1 F (36.7 C) (Oral)   Resp 16   SpO2 98%   Awake, alert but confused.  Does not remember simple conversations from yesterday or a few hours ago. Speech fluent, appropriate  CN grossly intact Mild diffuse BUE weakness, 4-/5 RLE, 4+/5 LLE  IMPRESSION:  58 y.o. male with diffuse weakness and gait instability likely related to cervical stenosis. Withdrawing from EtOH and is more lucid now but remains confused, not sure if he can fully comprehend consent for surgery although I have reviewed the surgery in detail with him.  PLAN: - I have spoken with the patient's wife (last night) and reviewed the indications for surgery, expected recovery, outcomes, and risks. Specifically, we did discuss the fact that the goal is to halt progression of symptoms and while recovery is possible, certainly not guaranteed. Risks were also reviewed including spinal cord or nerve injury, bleeding, stroke, CSF leak, infection, and need for additional surgeries. All her questions were answered and she agreed to proceed with surgery. - Will proceed with ACDF C5-6

## 2018-06-26 NOTE — Plan of Care (Signed)
Progressing towards goals
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
## 2018-06-26 NOTE — Transfer of Care (Signed)
Immediate Anesthesia Transfer of Care Note  Patient: Curtis Bowen  Procedure(s) Performed: FIVE- CERVICAL SIX (N/A Spine Cervical)  Patient Location: PACU  Anesthesia Type:General  Level of Consciousness: awake and drowsy  Airway & Oxygen Therapy: Patient Spontanous Breathing and Patient connected to face mask oxygen  Post-op Assessment: Report given to RN, Post -op Vital signs reviewed and stable and Patient moving all extremities X 4  Post vital signs: Reviewed and stable  Last Vitals:  Vitals Value Taken Time  BP 143/71 06/26/18 1309  Temp    Pulse 79 06/26/18 1310  Resp 20 06/26/18 1310  SpO2 100 % 06/26/18 1310  Vitals shown include unvalidated device data.  Last Pain:  Vitals:   06/26/18 0408  TempSrc:   PainSc: Asleep      Patients Stated Pain Goal: 0 (75/64/33 2951)  Complications: No apparent anesthesia complications

## 2018-06-26 NOTE — Op Note (Signed)
NEUROSURGERY OPERATIVE NOTE   PREOP DIAGNOSIS: Cervical stenosis with myelopathy, C5-6  POSTOP DIAGNOSIS: Same  PROCEDURE: 1. Discectomy at C5-6 for decompression of spinal cord and exiting nerve roots  2. Placement of intervertebral biomechanical device Medtronic Titan 6mm  Lordotic cage 3. Placement of anterior instrumentation consisting of interbody plate and screws - 19mm Medtronic plate, 15mm screws 4. Use of morselized bone allograft  5. Arthrodesis C5-6, anterior interbody technique  6. Use of intraoperative microscope  SURGEON: Dr. Lisbeth RenshawNeelesh Sissy Goetzke, MD  ASSISTANT: Cindra PresumeVincent Costella, PA-C  ANESTHESIA: General Endotracheal  EBL: 50cc  SPECIMENS: None  DRAINS: None  COMPLICATIONS: None immediate  CONDITION: Hemodynamically stable to PACU  HISTORY: Curtis Bowen is a 58 y.o. y.o. male who initially presented to the emergency department after being brought in by his wife with progressive lower extremity weakness and gait instability.  He is also an alcoholic and was suffering from withdrawals.  His outside MRI did demonstrate significant cervical stenosis at C5-6 with spinal cord compression.  Surgical decompression and fusion was therefore indicated.  After the patient had recovered from his alcohol withdrawal, and obtain medical clearance we did speak with the patient's wife regarding indications for surgery, risks benefits and expected postoperative course.  After all their questions were answered informed consent was obtained from the patient's wife.  PROCEDURE IN DETAIL: The patient was brought to the operating room and transferred to the operative table. After induction of general anesthesia, the patient was positioned on the operative table in the supine position with all pressure points meticulously padded. The skin of the neck was then prepped and draped in the usual sterile fashion.  After timeout was conducted, the skin was infiltrated with local anesthetic.  Skin incision was then made sharply and Bovie electrocautery was used to dissect the subcutaneous tissue until the platysma was identified. The platysma was then divided and undermined. The sternocleidomastoid muscle was then identified and, utilizing natural fascial planes in the neck, the prevertebral fascia was identified and the carotid sheath was retracted laterally and the trachea and esophagus retracted medially.  Spinal needle was then introduced into the C6-7 disc space which was confirmed with intraoperative fluoroscopy.  I therefore carried the dissection superiorly to identify the C5-6 disc space and repeat fluoroscopic image was taken.  At this point Bovie electrocautery was used to dissect in the subperiosteal plane and elevate the longus coli muscles bilaterally.  Table mounted retractors were then placed.  The disc space was then incised with a #11 blade scalpel.  Superficial discectomy was completed.  Rongeurs were then used to remove anterior osteophytes on the C5 and C6 vertebral bodies.  The microscope was then draped sterilely and brought into the field, and the remainder of the case was done under the microscope using microdissection technique.  The remainder of the disc was removed with a high-speed drill.  Curettes were used to remove the cartilaginous endplate.  There was a significant amount of posterior osteophytosis primarily from the posterior corner of the C5 vertebral body.  This was drilled down to identify the posterior annulus as well as the posterior longitudinal ligament.  Nerve hook was then placed to elevate the PLL, and the PLL was incised with a #1 thin plate Kerrison.  Kerrison punches were then used to remove the remainder of the osteophytes and the disc.  Once the decompression was completed, I was able to freely pass a small dissector within the ventral epidural space and out the C5-6 foramina indicating good  decompression.  Having completed our decompression,  attention was turned to placement of the intervertebral device. Trial spacers were used to select a 6 mm lordotic graft. This graft was then filled with morcellized allograft, and inserted under fluoroscopy.  After placement of the intervertebral device, the above anterior cervical plate was selected, and placed across the interspace. Using a high-speed drill, the cortex of the cervical vertebral bodies was punctured, and screws inserted in the level above and below. Final fluoroscopic image in lateral projection was taken to confirm good hardware placement.  At this point, after all counts were verified to be correct, meticulous hemostasis was secured using a combination of bipolar electrocautery and passive hemostatics. The platysma muscle was then closed using interrupted 3-0 Vicryl sutures, and the skin was closed with an interrupted 3-0 Vicry subcuticular stitch. Dermabond and sterile dressings were then applied and the drapes removed.  The patient tolerated the procedure well and was extubated in the room and taken to the postanesthesia care unit in stable condition.  At the end of the case all sponge, needle, instrument, and cottonoid counts were correct.

## 2018-06-27 ENCOUNTER — Encounter (HOSPITAL_COMMUNITY): Payer: Self-pay | Admitting: Neurosurgery

## 2018-06-27 LAB — NOVEL CORONAVIRUS, NAA (HOSP ORDER, SEND-OUT TO REF LAB; TAT 18-24 HRS): SARS-CoV-2, NAA: NOT DETECTED

## 2018-06-27 NOTE — Plan of Care (Signed)
Progressing towards goals

## 2018-06-27 NOTE — Care Management Important Message (Signed)
Important Message  Patient Details  Name: Curtis Bowen MRN: [id number] Date of Birth: December 29, 1960   Medicare Important Message Given:  Yes     Curtis Bowen 06/27/2018, 11:22 AM

## 2018-06-27 NOTE — Anesthesia Postprocedure Evaluation (Signed)
Anesthesia Post Note  Patient: Curtis Bowen  Procedure(s) Performed: FIVE- CERVICAL SIX (N/A Spine Cervical)     Patient location during evaluation: PACU Anesthesia Type: General Level of consciousness: sedated Pain management: pain level controlled Vital Signs Assessment: post-procedure vital signs reviewed and stable Respiratory status: spontaneous breathing Cardiovascular status: stable Postop Assessment: no apparent nausea or vomiting Anesthetic complications: no    Last Vitals:  Vitals:   06/27/18 0415 06/27/18 0714  BP: 138/80 126/76  Pulse: 79 80  Resp: 16 18  Temp: 36.7 C 36.9 C  SpO2: 98% 99%    Last Pain:  Vitals:   06/27/18 0800  TempSrc:   PainSc: 3    Pain Goal: Patients Stated Pain Goal: 0 (06/26/18 0307)                 Huston Foley

## 2018-06-27 NOTE — Progress Notes (Signed)
  NEUROSURGERY PROGRESS NOTE   No issues overnight.  Endorses mild neck pain No change in motor exam  EXAM:  BP 132/72 (BP Location: Right Arm)   Pulse 66   Temp 97.8 F (36.6 C) (Oral)   Resp 18   SpO2 99%   Awake, alert, oriented  Speech fluent, appropriate  CN grossly intact  LLE 4+/5, RLE 4-/5 Incision minimal dried blood on bandage. No swelling/fluctuance.  IMPRESSION/PLAN 58 y.o. male  Pod#1 C5-6 ACDF. Doing well. - PT/OT today
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
## 2018-06-28 LAB — COMPREHENSIVE METABOLIC PANEL
ALT: 10 U/L (ref 0–44)
AST: 15 U/L (ref 15–41)
Albumin: 2.7 g/dL — ABNORMAL LOW (ref 3.5–5.0)
Alkaline Phosphatase: 57 U/L (ref 38–126)
Anion gap: 10 (ref 5–15)
BUN: 8 mg/dL (ref 6–20)
CO2: 26 mmol/L (ref 22–32)
Calcium: 8.5 mg/dL — ABNORMAL LOW (ref 8.9–10.3)
Chloride: 99 mmol/L (ref 98–111)
Creatinine, Ser: 0.62 mg/dL (ref 0.61–1.24)
GFR calc Af Amer: >60 mL/min (ref >60)
GFR calc non Af Amer: >60 mL/min (ref >60)
Glucose, Bld: 95 mg/dL (ref 70–99)
Potassium: 3.2 mmol/L — ABNORMAL LOW (ref 3.5–5.1)
Sodium: 135 mmol/L (ref 135–145)
Total Bilirubin: 0.5 mg/dL (ref 0.3–1.2)
Total Protein: 5.1 g/dL — ABNORMAL LOW (ref 6.5–8.1)

## 2018-06-28 LAB — CBC WITH DIFFERENTIAL/PLATELET
Abs Immature Granulocytes: 0.08 10*3/uL — ABNORMAL HIGH (ref 0.00–0.07)
Basophils Absolute: 0 10*3/uL (ref 0.0–0.1)
Basophils Relative: 0 %
Eosinophils Absolute: 0.2 10*3/uL (ref 0.0–0.5)
Eosinophils Relative: 2 %
HCT: 33 % — ABNORMAL LOW (ref 39.0–52.0)
Hemoglobin: 11.6 g/dL — ABNORMAL LOW (ref 13.0–17.0)
Immature Granulocytes: 1 %
Lymphocytes Relative: 11 %
Lymphs Abs: 0.9 10*3/uL (ref 0.7–4.0)
MCH: 35.5 pg — ABNORMAL HIGH (ref 26.0–34.0)
MCHC: 35.2 g/dL (ref 30.0–36.0)
MCV: 100.9 fL — ABNORMAL HIGH (ref 80.0–100.0)
Monocytes Absolute: 1.2 10*3/uL — ABNORMAL HIGH (ref 0.1–1.0)
Monocytes Relative: 15 %
Neutro Abs: 5.7 10*3/uL (ref 1.7–7.7)
Neutrophils Relative %: 71 %
Platelets: 247 10*3/uL (ref 150–400)
RBC: 3.27 MIL/uL — ABNORMAL LOW (ref 4.22–5.81)
RDW: 15.1 % (ref 11.5–15.5)
WBC: 8.1 10*3/uL (ref 4.0–10.5)
nRBC: 0 % (ref 0.0–0.2)

## 2018-06-28 LAB — APTT: aPTT: 29 seconds (ref 24–36)

## 2018-06-28 LAB — PROTIME-INR
INR: 0.9 (ref 0.8–1.2)
Prothrombin Time: 12.4 seconds (ref 11.4–15.2)

## 2018-06-28 LAB — GLUCOSE, CAPILLARY: Glucose-Capillary: 98 mg/dL (ref 70–99)

## 2018-06-28 MED ORDER — MENTHOL 3 MG MT LOZG: 1.0000 | LOZENGE | OROMUCOSAL | Status: DC | PRN

## 2018-06-28 MED ORDER — SODIUM CHLORIDE 0.9% FLUSH: 3.0000 mL | Freq: Two times a day (BID) | INTRAVENOUS | Status: DC

## 2018-06-28 MED ORDER — CEFAZOLIN SODIUM-DEXTROSE 2-4 GM/100ML-% IV SOLN: 2.0000 g | Freq: Three times a day (TID) | INTRAVENOUS | Status: DC

## 2018-06-28 MED ORDER — SODIUM CHLORIDE 0.9 % IV SOLN: INTRAVENOUS | Status: DC

## 2018-06-28 MED ORDER — SODIUM CHLORIDE 0.9% FLUSH: 3.0000 mL | INTRAVENOUS | Status: DC | PRN

## 2018-06-28 MED ORDER — CEFAZOLIN SODIUM-DEXTROSE 2-4 GM/100ML-% IV SOLN
2.0000 g | INTRAVENOUS | Status: DC
  Filled 2018-06-28: qty 100

## 2018-06-28 MED ORDER — PHENOL 1.4 % MT LIQD: 1.0000 | OROMUCOSAL | Status: DC | PRN

## 2018-06-28 MED ORDER — POTASSIUM CHLORIDE 20 MEQ PO PACK
40.0000 meq | PACK | Freq: Once | ORAL | Status: AC
  Administered 2018-06-28: 40 meq via ORAL
  Filled 2018-06-28: qty 2

## 2018-06-28 MED ORDER — SODIUM CHLORIDE 0.9 % IV SOLN: 250.0000 mL | INTRAVENOUS | Status: DC

## 2018-06-28 NOTE — Progress Notes (Signed)
Rehab Admissions Coordinator Note:  Per OT recommendation, this patient was screened by Jhonnie Garner for appropriateness for an Inpatient Acute Rehab Consult.  At this time, we are recommending Inpatient Rehab consult. AC will contact MD to request a consult order.   Jhonnie Garner 06/28/2018, 4:44 PM  I can be reached at [phone].

## 2018-06-28 NOTE — Progress Notes (Signed)
No new issues or problems.  Patient reports that his pain is well controlled.  He is afebrile.  His vital signs are stable.  He is awake and alert.Motor examination stable with bilateral lower extremity weakness.  Wound clean and dry.  Overall stable.  Continue supportive care.  No new recommendations.
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
## 2018-06-29 ENCOUNTER — Other Ambulatory Visit: Payer: Self-pay

## 2018-06-29 LAB — BASIC METABOLIC PANEL
Anion gap: 7 (ref 5–15)
BUN: 6 mg/dL (ref 6–20)
CO2: 25 mmol/L (ref 22–32)
Calcium: 8.5 mg/dL — ABNORMAL LOW (ref 8.9–10.3)
Chloride: 101 mmol/L (ref 98–111)
Creatinine, Ser: 0.54 mg/dL — ABNORMAL LOW (ref 0.61–1.24)
GFR calc Af Amer: >60 mL/min (ref >60)
GFR calc non Af Amer: >60 mL/min (ref >60)
Glucose, Bld: 92 mg/dL (ref 70–99)
Potassium: 3.6 mmol/L (ref 3.5–5.1)
Sodium: 133 mmol/L — ABNORMAL LOW (ref 135–145)

## 2018-06-29 LAB — GLUCOSE, CAPILLARY
Glucose-Capillary: 109 mg/dL — ABNORMAL HIGH (ref 70–99)
Glucose-Capillary: 109 mg/dL — ABNORMAL HIGH (ref 70–99)
Glucose-Capillary: 138 mg/dL — ABNORMAL HIGH (ref 70–99)
Glucose-Capillary: 105 mg/dL — ABNORMAL HIGH (ref 70–99)

## 2018-06-29 MED ORDER — FOLIC ACID 1 MG PO TABS
1.0000 mg | ORAL_TABLET | Freq: Every day | ORAL | Status: DC
  Administered 2018-06-30 – 2018-07-01 (×2): 1 mg via ORAL
  Filled 2018-06-29 (×2): qty 1

## 2018-06-29 MED ORDER — ADULT MULTIVITAMIN W/MINERALS CH
1.0000 | ORAL_TABLET | Freq: Every day | ORAL | Status: DC
  Administered 2018-06-30 – 2018-07-01 (×2): 1 via ORAL
  Filled 2018-06-29 (×2): qty 1

## 2018-06-29 MED ORDER — LORAZEPAM 1 MG PO TABS
0.0000 mg | ORAL_TABLET | Freq: Four times a day (QID) | ORAL | Status: DC
  Administered 2018-06-29 – 2018-06-30 (×3): 2 mg via ORAL
  Administered 2018-06-30: 1 mg via ORAL
  Administered 2018-07-01: 03:00:00 2 mg via ORAL
  Administered 2018-07-01: 1 mg via ORAL
  Filled 2018-06-29: qty 2
  Filled 2018-06-29: qty 1
  Filled 2018-06-29 (×2): qty 2
  Filled 2018-06-29: qty 1
  Filled 2018-06-29 (×2): qty 2

## 2018-06-29 MED ORDER — VITAMIN B-1 100 MG PO TABS
100.0000 mg | ORAL_TABLET | Freq: Every day | ORAL | Status: DC
  Administered 2018-06-30 – 2018-07-01 (×2): 100 mg via ORAL
  Filled 2018-06-29 (×2): qty 1

## 2018-06-29 MED ORDER — THIAMINE HCL 100 MG/ML IJ SOLN: 100.0000 mg | Freq: Every day | INTRAMUSCULAR | Status: DC

## 2018-06-29 MED ORDER — LORAZEPAM 1 MG PO TABS
0.0000 mg | ORAL_TABLET | Freq: Two times a day (BID) | ORAL | Status: DC
  Administered 2018-07-01: 1 mg via ORAL
  Filled 2018-06-29: qty 2

## 2018-06-29 MED ORDER — LORAZEPAM 2 MG/ML IJ SOLN: 1.0000 mg | Freq: Four times a day (QID) | INTRAMUSCULAR | Status: DC | PRN

## 2018-06-29 MED ORDER — LORAZEPAM 1 MG PO TABS
1.0000 mg | ORAL_TABLET | Freq: Four times a day (QID) | ORAL | Status: DC | PRN
  Administered 2018-06-30: 1 mg via ORAL
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
## 2018-06-29 NOTE — Progress Notes (Signed)
No significant change in status.  Minimal neck pain.  Upper extremity symptoms improved compared to preop.  Still with bilateral lower extremity weakness which is not much changed from preop.  Wound clean and dry.  Chest and abdomen benign.  Status post C5-6 decompression and fusion surgery.  Continue rehab efforts.  No new recommendations.

## 2018-06-30 DIAGNOSIS — M4802 Spinal stenosis, cervical region: Secondary | ICD-10-CM

## 2018-06-30 LAB — GLUCOSE, CAPILLARY
Glucose-Capillary: 93 mg/dL (ref 70–99)
Glucose-Capillary: 80 mg/dL (ref 70–99)

## 2018-06-30 LAB — AMMONIA: Ammonia: 12 umol/L (ref 9–35)
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
## 2018-06-30 NOTE — Progress Notes (Signed)
  NEUROSURGERY PROGRESS NOTE   No issues overnight.  No concerns this am  EXAM:  BP (!) 141/89 (BP Location: Right Arm)   Pulse 67   Temp 97.9 F (36.6 C) (Oral)   Resp 18   SpO2 100%   Awake, alert Mildly confused CN grossly intact  BLE weakness R>L Incision: c/d/i, no swelling  PLAN Stable this am Continue PT/OT Patient is being considered for CIR No new NS recs. Plan for outpt f/u in 3 weeks Please call for any concerns
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
## 2018-06-30 NOTE — Care Management Important Message (Signed)
Important Message  Patient Details  Name: Curtis Bowen MRN: [id number] Date of Birth: 1960/07/08   Medicare Important Message Given:  Yes     [name] 06/30/2018, 2:19 PM

## 2018-07-01 ENCOUNTER — Other Ambulatory Visit: Payer: Self-pay

## 2018-07-01 ENCOUNTER — Encounter (HOSPITAL_COMMUNITY): Payer: Self-pay | Admitting: *Deleted

## 2018-07-01 ENCOUNTER — Inpatient Hospital Stay (HOSPITAL_COMMUNITY)
Admission: RE | Admit: 2018-07-01 | Discharge: 2018-07-16 | DRG: 560 | Disposition: A | Discharge status: Home Health | Payer: Medicare Other | Source: Intra-hospital | Attending: Physical Medicine & Rehabilitation | Admitting: Physical Medicine & Rehabilitation

## 2018-07-01 DIAGNOSIS — I10 Essential (primary) hypertension: Secondary | ICD-10-CM | POA: Diagnosis present

## 2018-07-01 DIAGNOSIS — Z23 Encounter for immunization: Secondary | ICD-10-CM | POA: Diagnosis not present

## 2018-07-01 DIAGNOSIS — M4712 Other spondylosis with myelopathy, cervical region: Secondary | ICD-10-CM

## 2018-07-01 DIAGNOSIS — Z4789 Encounter for other orthopedic aftercare: Principal | ICD-10-CM

## 2018-07-01 DIAGNOSIS — D649 Anemia, unspecified: Secondary | ICD-10-CM | POA: Diagnosis present

## 2018-07-01 DIAGNOSIS — G9340 Encephalopathy, unspecified: Secondary | ICD-10-CM

## 2018-07-01 DIAGNOSIS — B965 Pseudomonas (aeruginosa) (mallei) (pseudomallei) as the cause of diseases classified elsewhere: Secondary | ICD-10-CM | POA: Diagnosis present

## 2018-07-01 DIAGNOSIS — Z79899 Other long term (current) drug therapy: Secondary | ICD-10-CM

## 2018-07-01 DIAGNOSIS — Z833 Family history of diabetes mellitus: Secondary | ICD-10-CM

## 2018-07-01 DIAGNOSIS — F1010 Alcohol abuse, uncomplicated: Secondary | ICD-10-CM | POA: Diagnosis present

## 2018-07-01 DIAGNOSIS — R0989 Other specified symptoms and signs involving the circulatory and respiratory systems: Secondary | ICD-10-CM

## 2018-07-01 DIAGNOSIS — R509 Fever, unspecified: Secondary | ICD-10-CM

## 2018-07-01 DIAGNOSIS — F17210 Nicotine dependence, cigarettes, uncomplicated: Secondary | ICD-10-CM | POA: Diagnosis present

## 2018-07-01 DIAGNOSIS — K5900 Constipation, unspecified: Secondary | ICD-10-CM | POA: Diagnosis present

## 2018-07-01 DIAGNOSIS — G959 Disease of spinal cord, unspecified: Secondary | ICD-10-CM | POA: Diagnosis present

## 2018-07-01 DIAGNOSIS — Z7982 Long term (current) use of aspirin: Secondary | ICD-10-CM

## 2018-07-01 DIAGNOSIS — N390 Urinary tract infection, site not specified: Secondary | ICD-10-CM

## 2018-07-01 DIAGNOSIS — B9620 Unspecified Escherichia coli [E. coli] as the cause of diseases classified elsewhere: Secondary | ICD-10-CM | POA: Diagnosis present

## 2018-07-01 DIAGNOSIS — E639 Nutritional deficiency, unspecified: Secondary | ICD-10-CM

## 2018-07-01 LAB — COMPREHENSIVE METABOLIC PANEL
ALT: 10 U/L (ref 0–44)
AST: 15 U/L (ref 15–41)
Albumin: 2.6 g/dL — ABNORMAL LOW (ref 3.5–5.0)
Alkaline Phosphatase: 64 U/L (ref 38–126)
Anion gap: 8 (ref 5–15)
BUN: 7 mg/dL (ref 6–20)
CO2: 25 mmol/L (ref 22–32)
Calcium: 8.4 mg/dL — ABNORMAL LOW (ref 8.9–10.3)
Chloride: 103 mmol/L (ref 98–111)
Creatinine, Ser: 0.53 mg/dL — ABNORMAL LOW (ref 0.61–1.24)
GFR calc Af Amer: >60 mL/min (ref >60)
GFR calc non Af Amer: >60 mL/min (ref >60)
Glucose, Bld: 90 mg/dL (ref 70–99)
Potassium: 3.5 mmol/L (ref 3.5–5.1)
Sodium: 136 mmol/L (ref 135–145)
Total Bilirubin: 0.7 mg/dL (ref 0.3–1.2)
Total Protein: 5.2 g/dL — ABNORMAL LOW (ref 6.5–8.1)

## 2018-07-01 LAB — CBC WITH DIFFERENTIAL/PLATELET
Abs Immature Granulocytes: 0.1 10*3/uL — ABNORMAL HIGH (ref 0.00–0.07)
Basophils Absolute: 0.1 10*3/uL (ref 0.0–0.1)
Basophils Relative: 1 %
Eosinophils Absolute: 0.3 10*3/uL (ref 0.0–0.5)
Eosinophils Relative: 3 %
HCT: 32.5 % — ABNORMAL LOW (ref 39.0–52.0)
Hemoglobin: 11.2 g/dL — ABNORMAL LOW (ref 13.0–17.0)
Immature Granulocytes: 1 %
Lymphocytes Relative: 13 %
Lymphs Abs: 1.2 10*3/uL (ref 0.7–4.0)
MCH: 34.7 pg — ABNORMAL HIGH (ref 26.0–34.0)
MCHC: 34.5 g/dL (ref 30.0–36.0)
MCV: 100.6 fL — ABNORMAL HIGH (ref 80.0–100.0)
Monocytes Absolute: 1.2 10*3/uL — ABNORMAL HIGH (ref 0.1–1.0)
Monocytes Relative: 13 %
Neutro Abs: 6.5 10*3/uL (ref 1.7–7.7)
Neutrophils Relative %: 69 %
Platelets: 333 10*3/uL (ref 150–400)
RBC: 3.23 MIL/uL — ABNORMAL LOW (ref 4.22–5.81)
RDW: 14.8 % (ref 11.5–15.5)
WBC: 9.4 10*3/uL (ref 4.0–10.5)
nRBC: 0 % (ref 0.0–0.2)

## 2018-07-01 LAB — MAGNESIUM: Magnesium: 2 mg/dL (ref 1.7–2.4)

## 2018-07-01 MED ORDER — VITAMIN B-1 100 MG PO TABS
100.0000 mg | ORAL_TABLET | Freq: Every day | ORAL | Status: DC
  Administered 2018-07-02 – 2018-07-16 (×15): 100 mg via ORAL
  Filled 2018-07-01 (×16): qty 1

## 2018-07-01 MED ORDER — ENOXAPARIN SODIUM 40 MG/0.4ML ~~LOC~~ SOLN
40.0000 mg | SUBCUTANEOUS | Status: DC
  Administered 2018-07-01 – 2018-07-15 (×15): 40 mg via SUBCUTANEOUS
  Filled 2018-07-01 (×15): qty 0.4

## 2018-07-01 MED ORDER — FOLIC ACID 1 MG PO TABS: 1.0000 mg | ORAL_TABLET | Freq: Every day | ORAL | Status: DC

## 2018-07-01 MED ORDER — METOPROLOL TARTRATE 25 MG PO TABS: 25.0000 mg | ORAL_TABLET | Freq: Two times a day (BID) | ORAL | Status: DC

## 2018-07-01 MED ORDER — HYDRALAZINE HCL 25 MG PO TABS: 25.0000 mg | ORAL_TABLET | Freq: Four times a day (QID) | ORAL | Status: DC

## 2018-07-01 MED ORDER — THIAMINE HCL 100 MG/ML IJ SOLN
100.0000 mg | Freq: Every day | INTRAMUSCULAR | Status: DC
  Filled 2018-07-01 (×5): qty 1

## 2018-07-01 MED ORDER — ONDANSETRON HCL 4 MG/2ML IJ SOLN: 4.0000 mg | Freq: Four times a day (QID) | INTRAMUSCULAR | Status: DC | PRN

## 2018-07-01 MED ORDER — LORAZEPAM 2 MG/ML IJ SOLN: 1.0000 mg | Freq: Four times a day (QID) | INTRAMUSCULAR | Status: AC | PRN

## 2018-07-01 MED ORDER — ADULT MULTIVITAMIN W/MINERALS CH
1.0000 | ORAL_TABLET | Freq: Every day | ORAL | Status: DC
  Administered 2018-07-02 – 2018-07-16 (×15): 1 via ORAL
  Filled 2018-07-01 (×16): qty 1

## 2018-07-01 MED ORDER — ACETAMINOPHEN 650 MG RE SUPP: 650.0000 mg | Freq: Four times a day (QID) | RECTAL | Status: DC | PRN

## 2018-07-01 MED ORDER — FOLIC ACID 1 MG PO TABS
1.0000 mg | ORAL_TABLET | Freq: Every day | ORAL | Status: DC
  Administered 2018-07-02 – 2018-07-16 (×15): 1 mg via ORAL
  Filled 2018-07-01 (×16): qty 1

## 2018-07-01 MED ORDER — SORBITOL 70 % SOLN
30.0000 mL | Freq: Every day | Status: DC | PRN
  Administered 2018-07-07 – 2018-07-13 (×2): 30 mL via ORAL
  Filled 2018-07-01 (×4): qty 30

## 2018-07-01 MED ORDER — ONDANSETRON HCL 4 MG PO TABS: 4.0000 mg | ORAL_TABLET | Freq: Four times a day (QID) | ORAL | Status: DC | PRN

## 2018-07-01 MED ORDER — LORAZEPAM 1 MG PO TABS
0.0000 mg | ORAL_TABLET | Freq: Two times a day (BID) | ORAL | Status: DC
  Administered 2018-07-02: 1 mg via ORAL
  Filled 2018-07-01: qty 4

## 2018-07-01 MED ORDER — NICOTINE 21 MG/24HR TD PT24
21.0000 mg | MEDICATED_PATCH | Freq: Every day | TRANSDERMAL | Status: DC
  Administered 2018-07-02 – 2018-07-16 (×15): 21 mg via TRANSDERMAL
  Filled 2018-07-01 (×15): qty 1

## 2018-07-01 MED ORDER — THIAMINE HCL 100 MG PO TABS: 100.0000 mg | ORAL_TABLET | Freq: Every day | ORAL | Status: DC

## 2018-07-01 MED ORDER — ADULT MULTIVITAMIN W/MINERALS CH: 1.0000 | ORAL_TABLET | Freq: Every day | ORAL | Status: AC

## 2018-07-01 MED ORDER — LORAZEPAM 1 MG PO TABS
0.0000 mg | ORAL_TABLET | Freq: Four times a day (QID) | ORAL | Status: AC
  Administered 2018-07-01: 1 mg via ORAL
  Filled 2018-07-01: qty 1

## 2018-07-01 MED ORDER — PNEUMOCOCCAL VAC POLYVALENT 25 MCG/0.5ML IJ INJ
0.5000 mL | INJECTION | INTRAMUSCULAR | Status: AC
  Administered 2018-07-02: 0.5 mL via INTRAMUSCULAR
  Filled 2018-07-01: qty 0.5

## 2018-07-01 MED ORDER — HYDRALAZINE HCL 25 MG PO TABS
25.0000 mg | ORAL_TABLET | Freq: Four times a day (QID) | ORAL | Status: DC
  Administered 2018-07-01 – 2018-07-16 (×57): 25 mg via ORAL
  Filled 2018-07-01 (×59): qty 1

## 2018-07-01 MED ORDER — LORAZEPAM 1 MG PO TABS: 1.0000 mg | ORAL_TABLET | Freq: Four times a day (QID) | ORAL | Status: AC | PRN

## 2018-07-01 MED ORDER — METOPROLOL TARTRATE 25 MG PO TABS
25.0000 mg | ORAL_TABLET | Freq: Two times a day (BID) | ORAL | Status: DC
  Administered 2018-07-01 – 2018-07-16 (×30): 25 mg via ORAL
  Filled 2018-07-01 (×30): qty 1

## 2018-07-01 MED ORDER — ACETAMINOPHEN 325 MG PO TABS
650.0000 mg | ORAL_TABLET | Freq: Four times a day (QID) | ORAL | Status: DC | PRN
  Administered 2018-07-01 – 2018-07-15 (×26): 650 mg via ORAL
  Filled 2018-07-01 (×29): qty 2

## 2018-07-01 MED ORDER — ALBUTEROL SULFATE (2.5 MG/3ML) 0.083% IN NEBU: 2.5000 mg | INHALATION_SOLUTION | RESPIRATORY_TRACT | Status: DC | PRN

## 2018-07-01 NOTE — Progress Notes (Addendum)
Inpatient Rehab Admissions Coordinator:   I have approval from Dr. Doristine Bosworth for pt to admit to CIR today. I will let pt, RN, and CM know. Attempted to call wife, Arbie Cookey, but no answer and no voicemail option.  Will try again later today.   Shann Medal, PT, DPT Admissions Coordinator [phone] 07/01/18  11:19 AM
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
## 2018-07-01 NOTE — Plan of Care (Signed)
Patient stable, discussed POC with patient, agreeable with plan, denies question/concerns at this time.  

## 2018-07-01 NOTE — Progress Notes (Signed)
Meredith Staggers, MD  Physician  Physical Medicine and Rehabilitation  PMR Pre-admission  Signed  Date of Service:  07/01/2018 10:00 AM      Related encounter: Admission (Current) from 06/21/2018 in [facility]      Signed         Show:Clear all [x]Manual[x]Template[x]Copied  Added by: [x]Swartz, Celesta Gentile, MD[x]Britten Parady, Earnest Conroy, PT  []Hover for details PMR Admission Coordinator Pre-Admission Assessment  Patient: Curtis Bowen is an 58 y.o., male MRN: [id number] DOB: 16-Jul-1960 Height:   Weight:    Insurance Information HMO:     PPO:      PCP:      IPA:      80/20:      OTHER:  PRIMARY: Medicare Part A only      Policy#: [id number]      Subscriber: patient CM Name:       Phone#:      Fax#:  Pre-Cert#: verified on [employer]      Employer:  Benefits:  Phone #:      Name:  Eff. Date: Medicare Part A 06/08/1996, Part B inactive     Deduct: $1408      Out of Pocket Max: n/a      Life Max: n/a CIR: per Medicare guidelines      SNF: 20 full days Outpatient: not covered     Co-Pay:  Home Health: 100%      Co-Pay:  DME: 80%     Co-Pay: 20% Providers: pt choice  SECONDARY:       Policy#:       Subscriber:  CM Name:       Phone#:      Fax#:  Pre-Cert#:       Employer:  Benefits:  Phone #:      Name:  Eff. Date:      Deduct:       Out of Pocket Max:       Life Max:  CIR:       SNF:  Outpatient:      Co-Pay:  Home Health:       Co-Pay:  DME:      Co-Pay:   Medicaid Application Date:       Case Manager:  Disability Application Date:       Case Worker:   The "Data Collection Information Summary" for patients in Inpatient Rehabilitation Facilities with attached "Privacy Act [place] Records" was provided and verbally reviewed with: Family  Emergency Contact Information         Contact Information    Name Relation Home Work Mobile   Pizana,CAROL Spouse   [phone]      Current Medical History  Patient  Admitting Diagnosis: cervical myelopathy  History of Present Illness: Curtis Bowen is a 58 year old right-handed male history of alcohol use and tobacco use. He was using a straight point cane immediately prior to admission 2/2 progressing weakness, wife was providing assist.Presented 06/21/2018 with progressive lower extremity weakness and gait instability. COVID negative. CT of the head showed no acute intracranial abnormalities. MRI completed demonstrating significant cervical stenosis at C5-6 with spinal cord compression. Underwent discectomy C5-6 for decompression and arthrodesis anterior interbody technique 06/26/2018 per Dr. Kathyrn Sheriff. Hospital course pain management. No brace needed. Patient has been monitored closely for alcohol withdrawal. Tolerating a regular diet. Maintained on low-dose beta-blocker for some tachycardia. No chest pain or increasing shortness of breath.     Patient's medical record from [facility] has been reviewed by the rehabilitation admission coordinator and physician.  Past Medical History      Past Medical History:  Diagnosis Date  . Alcohol use     Family History   family history includes Diabetes in his paternal aunt.  Prior Rehab/Hospitalizations Has the patient had prior rehab or hospitalizations prior to admission? No  Has the patient had major surgery during 100 days prior to admission? Yes             Current Medications  Current Facility-Administered Medications:  .  acetaminophen (TYLENOL) tablet 650 mg, 650 mg, Oral, Q6H PRN, 650 mg at 07/01/18 0239 **OR** acetaminophen (TYLENOL) suppository 650 mg, 650 mg, Rectal, Q6H PRN, Posey Pronto, Vishal R, MD .  albuterol (PROVENTIL) (2.5 MG/3ML) 0.083% nebulizer solution 2.5 mg, 2.5 mg, Nebulization, Q2H PRN, Zada Finders R, MD, 2.5 mg at 06/22/18 1217 .  folic acid (FOLVITE) tablet 1 mg, 1 mg, Oral, Daily, Swayze, Ava, DO, 1 mg at 07/01/18 0921 .  hydrALAZINE (APRESOLINE)  tablet 25 mg, 25 mg, Oral, Q6H, Swayze, Ava, DO, 25 mg at 07/01/18 0921 .  LORazepam (ATIVAN) tablet 1 mg, 1 mg, Oral, Q6H PRN, 1 mg at 06/30/18 2017 **OR** LORazepam (ATIVAN) injection 1 mg, 1 mg, Intravenous, Q6H PRN, Swayze, Ava, DO .  LORazepam (ATIVAN) tablet 0-4 mg, 0-4 mg, Oral, Q6H, 1 mg at 07/01/18 0900 **FOLLOWED BY** LORazepam (ATIVAN) tablet 0-4 mg, 0-4 mg, Oral, Q12H, Swayze, Ava, DO .  menthol-cetylpyridinium (CEPACOL) lozenge 3 mg, 1 lozenge, Oral, PRN **OR** phenol (CHLORASEPTIC) mouth spray 1 spray, 1 spray, Mouth/Throat, PRN, Costella, Vincent J, PA-C .  metoprolol tartrate (LOPRESSOR) tablet 25 mg, 25 mg, Oral, BID, Swayze, Ava, DO, 25 mg at 07/01/18 2774 .  multivitamin with minerals tablet 1 tablet, 1 tablet, Oral, Daily, Swayze, Ava, DO, 1 tablet at 07/01/18 0921 .  nicotine (NICODERM CQ - dosed in mg/24 hours) patch 21 mg, 21 mg, Transdermal, Daily, Zada Finders R, MD, 21 mg at 07/01/18 0923 .  ondansetron (ZOFRAN) tablet 4 mg, 4 mg, Oral, Q6H PRN **OR** ondansetron (ZOFRAN) injection 4 mg, 4 mg, Intravenous, Q6H PRN, Zada Finders R, MD .  thiamine (VITAMIN B-1) tablet 100 mg, 100 mg, Oral, Daily, 100 mg at 07/01/18 0921 **OR** thiamine (B-1) injection 100 mg, 100 mg, Intravenous, Daily, Swayze, Ava, DO  Patients Current Diet:     Diet Order                  Diet regular Room service appropriate? No; Fluid consistency: Thin  Diet effective now               Precautions / Restrictions Precautions Precautions: Cervical, Fall Precaution Booklet Issued: Yes (comment) Precaution Comments: verbally went over cervical precautions - patient unable to recall Restrictions Weight Bearing Restrictions: No   Has the patient had 2 or more falls or a fall with injury in the past year? Yes  Prior Activity Level Limited Community (1-2x/wk): more limited recently PTA with decline in mobility, prior to decline had been active at home, mowing yard, etc  Prior  Functional Level Self Care: Did the patient need help bathing, dressing, using the toilet or eating? Independent prior to this event  Indoor Mobility: Did the patient need assistance with walking from room to room (with or without device)? Independent prior to this event  Stairs: Did the patient need assistance with internal or external stairs (with or without device)? Independent prior to this event  Functional Cognition: Did the patient need help planning regular tasks such as shopping or remembering to take medications? Needed some help  Home Assistive Devices / Equipment Home Equipment: Nicut - single point, Environmental consultant - 2 wheels  Prior Device Use: Indicate devices/aids used by the patient prior to current illness, exacerbation or injury? had been using a cane for a few weeks PTa  Current Functional Level Cognition  Overall Cognitive Status: Impaired/Different from baseline Current Attention Level: Selective Orientation Level: Oriented to person Following Commands: Follows one step commands consistently, Follows one step commands with increased time, Follows multi-step commands inconsistently Safety/Judgement: Decreased awareness of safety, Decreased awareness of deficits General Comments: patient with tangential conversation; attempting to use phone backwards; unable to recall cervical precautions    Extremity Assessment (includes Sensation/Coordination)  Upper Extremity Assessment: Defer to OT evaluation RUE Coordination: decreased fine motor LUE Coordination: decreased fine motor  Lower Extremity Assessment: RLE deficits/detail, LLE deficits/detail RLE Deficits / Details: PROM WFL, AROM limited by 2+/5 strength  RLE Sensation: decreased light touch RLE Coordination: decreased fine motor, decreased gross motor LLE Deficits / Details: PROM WFL, AROM limited by 2+/5 strength  LLE Sensation: decreased light touch LLE Coordination: decreased fine motor, decreased gross motor     ADLs  Overall ADL's : Needs assistance/impaired Eating/Feeding: Set up, Sitting Eating/Feeding Details (indicate cue type and reason): very slow. anterior neck surgery. Grooming: Set up, Sitting Upper Body Bathing: Set up, Sitting Lower Body Bathing: Maximal assistance, Sitting/lateral leans Upper Body Dressing : Set up, Sitting Lower Body Dressing: Maximal assistance, Sitting/lateral leans, Bed level Toilet Transfer: Total assistance Toilet Transfer Details (indicate cue type and reason): unable to simulate transfer. pt in too severe of pain and BLEs too weak Toileting- Clothing Manipulation and Hygiene: Maximal assistance, Total assistance, Bed level Functional mobility during ADLs: Maximal assistance, Total assistance, +2 for physical assistance, +2 for safety/equipment General ADL Comments: set-upA for UB ADL and maxA for LB ADL. pt unable to perform sit to stand as BLEs too weak. Pt talking about unrelated things so deferred transfer today as pt still disoriented.    Mobility  Overal bed mobility: Needs Assistance Bed Mobility: (long sitting to sitting EOB) Sidelying to sit: HOB elevated, Mod assist Supine to sit: HOB elevated, Mod assist Sit to supine: Max assist, HOB elevated General bed mobility comments: Min A for balance once EOB - patient using B UE to move LE to EOB    Transfers  Overall transfer level: Needs assistance Equipment used: Rolling walker (2 wheeled) Transfers: Sit to/from Stand, Lateral/Scoot Transfers Sit to Stand: Max assist, Total assist, +2 physical assistance Squat pivot transfers: Max assist  Lateral/Scoot Transfers: Mod assist, +2 physical assistance General transfer comment: Max A +2 to stand at bedside - use of gait belt and RW; requires cueing for hand placement to push up into standing; unable to achieve full extension with B knee buckle requiring quick return to sitting EOB - lateral scoot to recliner    Ambulation / Gait / Stairs /  Wheelchair Mobility  Ambulation/Gait General Gait Details: unable    Posture / Balance Balance Overall balance assessment: Needs assistance Sitting-balance support: Single extremity supported, Feet supported Sitting balance-Leahy Scale: Fair Standing balance support: Bilateral upper extremity supported, During functional activity Standing balance-Leahy Scale: Zero    Special needs/care consideration BiPAP/CPAP no CPM no Continuous Drip IV no Dialysis no        Days n/a Life Vest no Oxygen no Special Bed no Trach Size no  Wound Vac (area) no      Location n/a Skin abrasions to head, incision to anterior neck     Bowel mgmt: incontinent, last BM 6/20 Bladder mgmt: incontinent Diabetic mgmt: no Behavioral consideration: question of pt detoxing starting around day 9-10 of admission, on CIWA protocol, per RN on 6/23 has been stable with Q6 ativan.  Chemo/radiation no   Previous Home Environment (from acute therapy documentation) Living Arrangements: Alone Available Help at Discharge: Family, Available 24 hours/day Type of Home: Mobile home(storage building attached to family property) Home Layout: One level Home Access: Stairs to enter Entrance Stairs-Rails: Can reach both Entrance Stairs-Number of Steps: 6 Bathroom Shower/Tub: None(goes to parent's house to shower on property) Biochemist, clinical: (standard height in parent's house) Additional Comments: Pt lives on parent's property in a storage building that has been converted into a home with electricity and HVAC, but no shower/commode  Discharge Living Setting Plans for Discharge Living Setting: Patient's home Type of Home at Discharge: Other (Comment)(doublewide) Discharge Home Layout: One level Discharge Home Access: Stairs to enter Entrance Stairs-Rails: Can reach both Entrance Stairs-Number of Steps: 3 Discharge Bathroom Shower/Tub: Tub only, Walk-in shower Discharge Bathroom Toilet: Standard Discharge Bathroom  Accessibility: Yes How Accessible: Accessible via walker Does the patient have any problems obtaining your medications?: No  Social/Family/Support Systems Patient Roles: Spouse Anticipated Caregiver: wife, Arbie Cookey Anticipated Ambulance person Information: [phone] Ability/Limitations of Caregiver: min assist Caregiver Availability: 24/7 Discharge Plan Discussed with Primary Caregiver: Yes Is Caregiver In Agreement with Plan?: Yes Does Caregiver/Family have Issues with Lodging/Transportation while Pt is in Rehab?: No  Goals/Additional Needs Patient/Family Goal for Rehab: PT/OT supervision Expected length of stay: 18-21 days Dietary Needs: reg/thin Additional Information: Pt with extensive alochol history, has been requiring oral ativan ~1/day Pt/Family Agrees to Admission and willing to participate: Yes Program Orientation Provided & Reviewed with Pt/Caregiver Including Roles  & Responsibilities: Yes  Decrease burden of Care through IP rehab admission: n/a  Possible need for SNF placement upon discharge: not anticipated  Patient Condition: I have reviewed [facility], spoken with CM, and patient and spouse. I met with patient at the bedside and discussed with spouse via phone for inpatient rehabilitation assessment.  Patient will benefit from ongoing PT and OT, can actively participate in 3 hours of therapy a day 5 days of the week, and can make measurable gains during the admission.  Patient will also benefit from the coordinated team approach during an Inpatient Acute Rehabilitation admission.  The patient will receive intensive therapy as well as Rehabilitation physician, nursing, social worker, and care management interventions.  Due to bladder management, bowel management, safety, skin/wound care, disease management, medication administration, pain management and patient education the patient requires 24 hour a day rehabilitation nursing.  The  patient is currently Max + with mobility and basic ADLs.  Discharge setting and therapy post discharge at home with home health is anticipated.  Patient has agreed to participate in the Acute Inpatient Rehabilitation Program and will admit today.  Preadmission Screen Completed By:  Michel Santee, PT, DPT 07/01/2018 11:21 AM ______________________________________________________________________   Discussed status with Dr. Naaman Plummer on 07/01/18  at There are other unrelated non-urgent complaints, but due to the busy schedule and the amount of time I've already spent with him, time does not permit me to address these routine issues at today's visit. I've requested another appointment to review these additional issues.  and received approval for admission today.  Admission Coordinator:  Michel Santee, PT, DPT, 07/01/18/11:21 AM    Assessment/Plan: Diagnosis:cervical myelopathy 1. Does the need for close, 24 hr/day Medical supervision in concert with the patient's rehab needs make it unreasonable for this patient to be served in a less intensive setting? Yes 2. Co-Morbidities requiring supervision/potential complications: etoh abuse 3. Due to bladder management, bowel management, safety, skin/wound care, disease management, medication administration, pain management and patient education, does the patient require 24 hr/day rehab nursing? Yes 4. Does the patient require coordinated care of a physician, rehab nurse, PT (1-2 hrs/day, 5 days/week) and OT (1-2 hrs/day, 5 days/week) to address physical and functional deficits in the context of the above medical diagnosis(es)? Yes Addressing deficits in the following areas: balance, endurance, locomotion, strength, transferring, bowel/bladder control, bathing, dressing, feeding, grooming, toileting and psychosocial support 5. Can the patient actively participate in an intensive therapy program of at least 3 hrs of therapy 5 days a week? Yes 6. The potential  for patient to make measurable gains while on inpatient rehab is excellent 7. Anticipated functional outcomes upon discharge from inpatients are: supervision PT, supervision OT, n/a SLP 8. Estimated rehab length of stay to reach the above functional goals is: 18-21 days 9. Anticipated D/C setting: Home 10. Anticipated post D/C treatments: [place] therapy 11. Overall Rehab/Functional Prognosis: excellent  MD Signature: Meredith Staggers, MD, [facility] 07/01/2018         Revision History
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
## 2018-07-01 NOTE — Discharge Instructions (Signed)
Alcohol Withdrawal Syndrome °When a person who drinks a lot of alcohol stops drinking, he or she may have unpleasant and serious symptoms. These symptoms are called alcohol withdrawal syndrome. This condition may be mild or severe. It can be life-threatening. It can cause: °· Shaking that you cannot control (tremor). °· Sweating. °· Headache. °· Feeling fearful, upset, grouchy, or depressed. °· Trouble sleeping (insomnia). °· Nightmares. °· Fast or uneven heartbeats (palpitations). °· Alcohol cravings. °· Feeling sick to your stomach (nausea). °· Throwing up (vomiting). °· Being bothered by light and sounds. °· Confusion. °· Trouble thinking clearly. °· Not being hungry (loss of appetite). °· Big changes in mood (mood swings). °If you have all of the following symptoms at the same time, get help right away: °· High blood pressure. °· Fast heartbeat. °· Trouble breathing. °· Seizures. °· Seeing, hearing, feeling, smelling, or tasting things that are not there (hallucinations). °These symptoms are known as delirium tremens (DTs). They must be treated at the hospital right away. °Follow these instructions at home: ° °· Take over-the-counter and prescription medicines only as told by your doctor. This includes vitamins. °· Do not drink alcohol. °· Do not drive until your doctor says that this is safe for you. °· Have someone stay with you or be available in case you need help. This should be someone you trust. This person can help you with your symptoms. He or she can also help you to not drink. °· Drink enough fluid to keep your pee (urine) pale yellow. °· Think about joining a support group or a treatment program to help you stop drinking. °· Keep all follow-up visits as told by your doctor. This is important. °Contact a doctor if: °· Your symptoms get worse. °· You cannot eat or drink without throwing up. °· You have a hard time not drinking alcohol. °· You cannot stop drinking alcohol. °Get help right away  if: °· You have fast or uneven heartbeats. °· You have chest pain. °· You have trouble breathing. °· You have a seizure for the first time. °· You see, hear, feel, smell, or taste something that is not there. °· You get very confused. °Summary °· When a person who drinks a lot of alcohol stops drinking, he or she may have serious symptoms. This is called alcohol withdrawal syndrome. °· Delirium tremens (DTs) is a group of life-threatening symptoms. You should get help right away if you have these symptoms. °· Think about joining an alcohol support group or a treatment program. °This information is not intended to replace advice given to you by your health care provider. Make sure you discuss any questions you have with your health care provider. °Document Released: 06/13/2007 Document Revised: 08/31/2016 Document Reviewed: 08/31/2016 °Elsevier Interactive Patient Education © 2019 [employer] ° °
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
## 2018-07-01 NOTE — TOC Transition Note (Signed)
Transition of Care [facility]) - CM/SW Discharge Note   Patient Details  Name: Marqui Formby MRN: [id number] Date of Birth: September 24, 1960  [facility] CM/SW Contact:  Pollie Friar, RN Phone Number: 07/01/2018, 12:21 PM   Clinical Narrative:    Pt is discharging to CIR today. TOC is signing off.   Final next level of care: IP Rehab Facility Barriers to Discharge: No Barriers Identified   Patient Goals and CMS Choice        Discharge Placement                       Discharge Plan and Services   Discharge Planning Services: CM Consult                                 Social Determinants of Health (SDOH) Interventions     Readmission Risk Interventions No flowsheet data found.

## 2018-07-01 NOTE — Progress Notes (Signed)
Received pt. As a new admission,pt. Has been oriented to the unit protocol.
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
## 2018-07-01 NOTE — PMR Pre-admission (Signed)
PMR Admission Coordinator Pre-Admission Assessment  Patient: Curtis Bowen is an 58 y.o., male MRN: [id number] DOB: Oct 29, 1960 Height:   Weight:    Insurance Information HMO:     PPO:      PCP:      IPA:      80/20:      OTHER:  PRIMARY: Medicare Part A only      Policy#: [id number]      Subscriber: patient CM Name:       Phone#:      Fax#:  Pre-Cert#: verified on [employer]port [employer]      Employer:  Benefits:  Phone #:      Name:  Eff. Date: Medicare Part A 06/08/1996, Part B inactive     Deduct: $1408      Out of Pocket Max: n/a      Life Max: n/a CIR: per Medicare guidelines      SNF: 20 full days Outpatient: not covered     Co-Pay:  Home Health: 100%      Co-Pay:  DME: 80%     Co-Pay: 20% Providers: pt choice  SECONDARY:       Policy#:       Subscriber:  CM Name:       Phone#:      Fax#:  Pre-Cert#:       Employer:  Benefits:  Phone #:      Name:  Eff. Date:      Deduct:       Out of Pocket Max:       Life Max:  CIR:       SNF:  Outpatient:      Co-Pay:  Home Health:       Co-Pay:  DME:      Co-Pay:   Medicaid Application Date:       Case Manager:  Disability Application Date:       Case Worker:   The "Data Collection Information Summary" for patients in Inpatient Rehabilitation Facilities with attached "Privacy Act [place] Records" was provided and verbally reviewed with: Family  Emergency Contact Information Contact Information    Name Relation Home Work Mobile   Lomanto,CAROL Spouse   [phone]      Current Medical History  Patient Admitting Diagnosis: cervical myelopathy  History of Present Illness: Curtis Bowen is a 58 year old right-handed male history of alcohol use and tobacco use. He was using a straight point cane immediately prior to admission 2/2 progressing weakness, wife was providing assist.   Presented 06/21/2018 with progressive lower extremity weakness and gait instability.  COVID negative.  CT of the head showed no acute  intracranial abnormalities.  MRI completed demonstrating significant cervical stenosis at C5-6 with spinal cord compression.  Underwent discectomy C5-6 for decompression and arthrodesis anterior interbody technique 06/26/2018 per Dr. Kathyrn Sheriff.  Hospital course pain management.  No brace needed.  Patient has been monitored closely for alcohol withdrawal.  Tolerating a regular diet.  Maintained on low-dose beta-blocker for some tachycardia.  No chest pain or increasing shortness of breath.      Patient's medical record from [facility] has been reviewed by the rehabilitation admission coordinator and physician.  Past Medical History  Past Medical History:  Diagnosis Date  . Alcohol use     Family History   family history includes Diabetes in his paternal aunt.  Prior Rehab/Hospitalizations Has the patient had prior rehab or hospitalizations prior to admission? No  Has the patient had major surgery during  100 days prior to admission? Yes   Current Medications  Current Facility-Administered Medications:  .  acetaminophen (TYLENOL) tablet 650 mg, 650 mg, Oral, Q6H PRN, 650 mg at 07/01/18 0239 **OR** acetaminophen (TYLENOL) suppository 650 mg, 650 mg, Rectal, Q6H PRN, Posey Pronto, Vishal R, MD .  albuterol (PROVENTIL) (2.5 MG/3ML) 0.083% nebulizer solution 2.5 mg, 2.5 mg, Nebulization, Q2H PRN, Zada Finders R, MD, 2.5 mg at 06/22/18 1217 .  folic acid (FOLVITE) tablet 1 mg, 1 mg, Oral, Daily, Swayze, Ava, DO, 1 mg at 07/01/18 0921 .  hydrALAZINE (APRESOLINE) tablet 25 mg, 25 mg, Oral, Q6H, Swayze, Ava, DO, 25 mg at 07/01/18 0921 .  LORazepam (ATIVAN) tablet 1 mg, 1 mg, Oral, Q6H PRN, 1 mg at 06/30/18 2017 **OR** LORazepam (ATIVAN) injection 1 mg, 1 mg, Intravenous, Q6H PRN, Swayze, Ava, DO .  LORazepam (ATIVAN) tablet 0-4 mg, 0-4 mg, Oral, Q6H, 1 mg at 07/01/18 0900 **FOLLOWED BY** LORazepam (ATIVAN) tablet 0-4 mg, 0-4 mg, Oral, Q12H, Swayze, Ava, DO .  menthol-cetylpyridinium (CEPACOL)  lozenge 3 mg, 1 lozenge, Oral, PRN **OR** phenol (CHLORASEPTIC) mouth spray 1 spray, 1 spray, Mouth/Throat, PRN, Costella, Vincent J, PA-C .  metoprolol tartrate (LOPRESSOR) tablet 25 mg, 25 mg, Oral, BID, Swayze, Ava, DO, 25 mg at 07/01/18 4287 .  multivitamin with minerals tablet 1 tablet, 1 tablet, Oral, Daily, Swayze, Ava, DO, 1 tablet at 07/01/18 0921 .  nicotine (NICODERM CQ - dosed in mg/24 hours) patch 21 mg, 21 mg, Transdermal, Daily, Zada Finders R, MD, 21 mg at 07/01/18 0923 .  ondansetron (ZOFRAN) tablet 4 mg, 4 mg, Oral, Q6H PRN **OR** ondansetron (ZOFRAN) injection 4 mg, 4 mg, Intravenous, Q6H PRN, Zada Finders R, MD .  thiamine (VITAMIN B-1) tablet 100 mg, 100 mg, Oral, Daily, 100 mg at 07/01/18 0921 **OR** thiamine (B-1) injection 100 mg, 100 mg, Intravenous, Daily, Swayze, Ava, DO  Patients Current Diet:  Diet Order            Diet regular Room service appropriate? No; Fluid consistency: Thin  Diet effective now              Precautions / Restrictions Precautions Precautions: Cervical, Fall Precaution Booklet Issued: Yes (comment) Precaution Comments: verbally went over cervical precautions - patient unable to recall Restrictions Weight Bearing Restrictions: No   Has the patient had 2 or more falls or a fall with injury in the past year? Yes  Prior Activity Level Limited Community (1-2x/wk): more limited recently PTA with decline in mobility, prior to decline had been active at home, mowing yard, etc  Prior Functional Level Self Care: Did the patient need help bathing, dressing, using the toilet or eating? Independent prior to this event  Indoor Mobility: Did the patient need assistance with walking from room to room (with or without device)? Independent prior to this event  Stairs: Did the patient need assistance with internal or external stairs (with or without device)? Independent prior to this event  Functional Cognition: Did the patient need help planning  regular tasks such as shopping or remembering to take medications? Needed some help  Home Assistive Devices / Equipment Home Equipment: [place] - single point, Environmental consultant - 2 wheels  Prior Device Use: Indicate devices/aids used by the patient prior to current illness, exacerbation or injury? had been using a cane for a few weeks PTa  Current Functional Level Cognition  Overall Cognitive Status: Impaired/Different from baseline Current Attention Level: Selective Orientation Level: Oriented to person Following Commands: Follows one step  commands consistently, Follows one step commands with increased time, Follows multi-step commands inconsistently Safety/Judgement: Decreased awareness of safety, Decreased awareness of deficits General Comments: patient with tangential conversation; attempting to use phone backwards; unable to recall cervical precautions    Extremity Assessment (includes Sensation/Coordination)  Upper Extremity Assessment: Defer to OT evaluation RUE Coordination: decreased fine motor LUE Coordination: decreased fine motor  Lower Extremity Assessment: RLE deficits/detail, LLE deficits/detail RLE Deficits / Details: PROM WFL, AROM limited by 2+/5 strength  RLE Sensation: decreased light touch RLE Coordination: decreased fine motor, decreased gross motor LLE Deficits / Details: PROM WFL, AROM limited by 2+/5 strength  LLE Sensation: decreased light touch LLE Coordination: decreased fine motor, decreased gross motor    ADLs  Overall ADL's : Needs assistance/impaired Eating/Feeding: Set up, Sitting Eating/Feeding Details (indicate cue type and reason): very slow. anterior neck surgery. Grooming: Set up, Sitting Upper Body Bathing: Set up, Sitting Lower Body Bathing: Maximal assistance, Sitting/lateral leans Upper Body Dressing : Set up, Sitting Lower Body Dressing: Maximal assistance, Sitting/lateral leans, Bed level Toilet Transfer: Total assistance Toilet Transfer Details  (indicate cue type and reason): unable to simulate transfer. pt in too severe of pain and BLEs too weak Toileting- Clothing Manipulation and Hygiene: Maximal assistance, Total assistance, Bed level Functional mobility during ADLs: Maximal assistance, Total assistance, +2 for physical assistance, +2 for safety/equipment General ADL Comments: set-upA for UB ADL and maxA for LB ADL. pt unable to perform sit to stand as BLEs too weak. Pt talking about unrelated things so deferred transfer today as pt still disoriented.    Mobility  Overal bed mobility: Needs Assistance Bed Mobility: (long sitting to sitting EOB) Sidelying to sit: HOB elevated, Mod assist Supine to sit: HOB elevated, Mod assist Sit to supine: Max assist, HOB elevated General bed mobility comments: Min A for balance once EOB - patient using B UE to move LE to EOB    Transfers  Overall transfer level: Needs assistance Equipment used: Rolling walker (2 wheeled) Transfers: Sit to/from Stand, Lateral/Scoot Transfers Sit to Stand: Max assist, Total assist, +2 physical assistance Squat pivot transfers: Max assist  Lateral/Scoot Transfers: Mod assist, +2 physical assistance General transfer comment: Max A +2 to stand at bedside - use of gait belt and RW; requires cueing for hand placement to push up into standing; unable to achieve full extension with B knee buckle requiring quick return to sitting EOB - lateral scoot to recliner    Ambulation / Gait / Stairs / Wheelchair Mobility  Ambulation/Gait General Gait Details: unable    Posture / Balance Balance Overall balance assessment: Needs assistance Sitting-balance support: Single extremity supported, Feet supported Sitting balance-Leahy Scale: Fair Standing balance support: Bilateral upper extremity supported, During functional activity Standing balance-Leahy Scale: Zero    Special needs/care consideration BiPAP/CPAP no CPM no Continuous Drip IV no Dialysis no        Days  n/a Life Vest no Oxygen no Special Bed no Trach Size no Wound Vac (area) no      Location n/a Skin abrasions to head, incision to anterior neck     Bowel mgmt: incontinent, last BM 6/20 Bladder mgmt: incontinent Diabetic mgmt: no Behavioral consideration: question of pt detoxing starting around day 9-10 of admission, on CIWA protocol, per RN on 6/23 has been stable with Q6 ativan.  Chemo/radiation no   Previous Home Environment (from acute therapy documentation) Living Arrangements: Alone Available Help at Discharge: Family, Available 24 hours/day Type of Home: Mobile home(storage building attached  to family property) Home Layout: One level Home Access: Stairs to enter Entrance Stairs-Rails: Can reach both Entrance Stairs-Number of Steps: 6 Bathroom Shower/Tub: None(goes to parent's house to shower on property) Biochemist, clinical: (standard height in parent's house) Additional Comments: Pt lives on parent's property in a storage building that has been converted into a home with electricity and HVAC, but no shower/commode  Discharge Living Setting Plans for Discharge Living Setting: Patient's home Type of Home at Discharge: Other (Comment)(doublewide) Discharge Home Layout: One level Discharge Home Access: Stairs to enter Entrance Stairs-Rails: Can reach both Entrance Stairs-Number of Steps: 3 Discharge Bathroom Shower/Tub: Tub only, Walk-in shower Discharge Bathroom Toilet: Standard Discharge Bathroom Accessibility: Yes How Accessible: Accessible via walker Does the patient have any problems obtaining your medications?: No  Social/Family/Support Systems Patient Roles: Spouse Anticipated Caregiver: wife, Arbie Cookey Anticipated Ambulance person Information: [phone] Ability/Limitations of Caregiver: min assist Caregiver Availability: 24/7 Discharge Plan Discussed with Primary Caregiver: Yes Is Caregiver In Agreement with Plan?: Yes Does Caregiver/Family have Issues with  Lodging/Transportation while Pt is in Rehab?: No  Goals/Additional Needs Patient/Family Goal for Rehab: PT/OT supervision Expected length of stay: 18-21 days Dietary Needs: reg/thin Additional Information: Pt with extensive alochol history, has been requiring oral ativan ~1/day Pt/Family Agrees to Admission and willing to participate: Yes Program Orientation Provided & Reviewed with Pt/Caregiver Including Roles  & Responsibilities: Yes  Decrease burden of Care through IP rehab admission: n/a  Possible need for SNF placement upon discharge: not anticipated  Patient Condition: I have reviewed medical records from [facility], spoken with CM, and patient and spouse. I met with patient at the bedside and discussed with spouse via phone for inpatient rehabilitation assessment.  Patient will benefit from ongoing PT and OT, can actively participate in 3 hours of therapy a day 5 days of the week, and can make measurable gains during the admission.  Patient will also benefit from the coordinated team approach during an Inpatient Acute Rehabilitation admission.  The patient will receive intensive therapy as well as Rehabilitation physician, nursing, social worker, and care management interventions.  Due to bladder management, bowel management, safety, skin/wound care, disease management, medication administration, pain management and patient education the patient requires 24 hour a day rehabilitation nursing.  The patient is currently Max + with mobility and basic ADLs.  Discharge setting and therapy post discharge at home with home health is anticipated.  Patient has agreed to participate in the Acute Inpatient Rehabilitation Program and will admit today.  Preadmission Screen Completed By:  Michel Santee, PT, DPT 07/01/2018 11:21 AM ______________________________________________________________________   Discussed status with Dr. Naaman Plummer on 07/01/18  at There are other unrelated non-urgent  complaints, but due to the busy schedule and the amount of time I've already spent with him, time does not permit me to address these routine issues at today's visit. I've requested another appointment to review these additional issues.  and received approval for admission today.  Admission Coordinator:  Michel Santee, PT, DPT, 07/01/18/11:21 AM    Assessment/Plan: Diagnosis:cervical myelopathy 1. Does the need for close, 24 hr/day Medical supervision in concert with the patient's rehab needs make it unreasonable for this patient to be served in a less intensive setting? Yes 2. Co-Morbidities requiring supervision/potential complications: etoh abuse 3. Due to bladder management, bowel management, safety, skin/wound care, disease management, medication administration, pain management and patient education, does the patient require 24 hr/day rehab nursing? Yes 4. Does the patient require coordinated care of a  physician, rehab nurse, PT (1-2 hrs/day, 5 days/week) and OT (1-2 hrs/day, 5 days/week) to address physical and functional deficits in the context of the above medical diagnosis(es)? Yes Addressing deficits in the following areas: balance, endurance, locomotion, strength, transferring, bowel/bladder control, bathing, dressing, feeding, grooming, toileting and psychosocial support 5. Can the patient actively participate in an intensive therapy program of at least 3 hrs of therapy 5 days a week? Yes 6. The potential for patient to make measurable gains while on inpatient rehab is excellent 7. Anticipated functional outcomes upon discharge from inpatients are: supervision PT, supervision OT, n/a SLP 8. Estimated rehab length of stay to reach the above functional goals is: 18-21 days 9. Anticipated D/C setting: Home 10. Anticipated post D/C treatments: [place] therapy 11. Overall Rehab/Functional Prognosis: excellent  MD Signature: Meredith Staggers, MD, [facility] 07/01/2018

## 2018-07-02 ENCOUNTER — Inpatient Hospital Stay (HOSPITAL_COMMUNITY): Payer: Medicare Other

## 2018-07-02 ENCOUNTER — Other Ambulatory Visit: Payer: Self-pay

## 2018-07-02 ENCOUNTER — Inpatient Hospital Stay (HOSPITAL_COMMUNITY): Payer: Self-pay | Admitting: Speech Pathology

## 2018-07-02 ENCOUNTER — Encounter: Payer: Self-pay | Admitting: Physical Therapy

## 2018-07-02 ENCOUNTER — Inpatient Hospital Stay (HOSPITAL_COMMUNITY): Payer: Self-pay | Admitting: Occupational Therapy

## 2018-07-02 ENCOUNTER — Inpatient Hospital Stay (HOSPITAL_COMMUNITY): Payer: Self-pay

## 2018-07-02 ENCOUNTER — Inpatient Hospital Stay (HOSPITAL_COMMUNITY): Payer: Self-pay | Admitting: Physical Therapy

## 2018-07-02 DIAGNOSIS — G959 Disease of spinal cord, unspecified: Secondary | ICD-10-CM

## 2018-07-02 DIAGNOSIS — M7989 Other specified soft tissue disorders: Secondary | ICD-10-CM

## 2018-07-02 DIAGNOSIS — F10231 Alcohol dependence with withdrawal delirium: Secondary | ICD-10-CM

## 2018-07-02 LAB — URINALYSIS, COMPLETE (UACMP) WITH MICROSCOPIC
Bilirubin Urine: NEGATIVE
Glucose, UA: NEGATIVE mg/dL
Hgb urine dipstick: NEGATIVE
Ketones, ur: 5 mg/dL — AB
Leukocytes,Ua: NEGATIVE
Nitrite: POSITIVE — AB
Protein, ur: NEGATIVE mg/dL
Specific Gravity, Urine: 1.012 (ref 1.005–1.030)
pH: 6 (ref 5.0–8.0)

## 2018-07-02 LAB — CBC WITH DIFFERENTIAL/PLATELET
Abs Immature Granulocytes: 0.09 10*3/uL — ABNORMAL HIGH (ref 0.00–0.07)
Basophils Absolute: 0.1 10*3/uL (ref 0.0–0.1)
Basophils Relative: 1 %
Eosinophils Absolute: 0.3 10*3/uL (ref 0.0–0.5)
Eosinophils Relative: 4 %
HCT: 34.4 % — ABNORMAL LOW (ref 39.0–52.0)
Hemoglobin: 12.2 g/dL — ABNORMAL LOW (ref 13.0–17.0)
Immature Granulocytes: 1 %
Lymphocytes Relative: 11 %
Lymphs Abs: 0.9 10*3/uL (ref 0.7–4.0)
MCH: 35.6 pg — ABNORMAL HIGH (ref 26.0–34.0)
MCHC: 35.5 g/dL (ref 30.0–36.0)
MCV: 100.3 fL — ABNORMAL HIGH (ref 80.0–100.0)
Monocytes Absolute: 0.9 10*3/uL (ref 0.1–1.0)
Monocytes Relative: 11 %
Neutro Abs: 5.9 10*3/uL (ref 1.7–7.7)
Neutrophils Relative %: 72 %
Platelets: 374 10*3/uL (ref 150–400)
RBC: 3.43 MIL/uL — ABNORMAL LOW (ref 4.22–5.81)
RDW: 14.6 % (ref 11.5–15.5)
WBC: 8.3 10*3/uL (ref 4.0–10.5)
nRBC: 0 % (ref 0.0–0.2)

## 2018-07-02 LAB — COMPREHENSIVE METABOLIC PANEL
ALT: 11 U/L (ref 0–44)
AST: 18 U/L (ref 15–41)
Albumin: 2.6 g/dL — ABNORMAL LOW (ref 3.5–5.0)
Alkaline Phosphatase: 70 U/L (ref 38–126)
Anion gap: 10 (ref 5–15)
BUN: <5 mg/dL — ABNORMAL LOW (ref 6–20)
CO2: 26 mmol/L (ref 22–32)
Calcium: 8.6 mg/dL — ABNORMAL LOW (ref 8.9–10.3)
Chloride: 100 mmol/L (ref 98–111)
Creatinine, Ser: 0.62 mg/dL (ref 0.61–1.24)
GFR calc Af Amer: >60 mL/min (ref >60)
GFR calc non Af Amer: >60 mL/min (ref >60)
Glucose, Bld: 89 mg/dL (ref 70–99)
Potassium: 3.5 mmol/L (ref 3.5–5.1)
Sodium: 136 mmol/L (ref 135–145)
Total Bilirubin: 0.5 mg/dL (ref 0.3–1.2)
Total Protein: 5.5 g/dL — ABNORMAL LOW (ref 6.5–8.1)

## 2018-07-02 NOTE — Progress Notes (Signed)
Bilateral lower extremity venous duplex completed. Preliminary results in Chart review CV Proc. [place] Areli Frary,RVS 07/02/2018 5:44 pm

## 2018-07-02 NOTE — Progress Notes (Signed)
Inpatient Rehabilitation  Patient information reviewed and entered into eRehab system by Chandria Rookstool M. Joslynn Jamroz, M.A., CCC/SLP, PPS Coordinator.  Information including medical coding, functional ability and quality indicators will be reviewed and updated through discharge.    

## 2018-07-02 NOTE — Plan of Care (Signed)
  Problem: SCI BOWEL ELIMINATION Goal: RH STG MANAGE BOWEL WITH ASSISTANCE Description: STG Manage Bowel with min.Assistance. Outcome: Progressing Goal: RH STG SCI MANAGE BOWEL WITH MEDICATION WITH ASSISTANCE Description: STG SCI Manage bowel with medication with min.assistance. Outcome: Progressing   Problem: RH SKIN INTEGRITY Goal: RH STG SKIN FREE OF INFECTION/BREAKDOWN Description: With min. assist Outcome: Progressing Goal: RH STG MAINTAIN SKIN INTEGRITY WITH ASSISTANCE Description: STG Maintain Skin Integrity With min.Assistance. Outcome: Progressing   Problem: RH PAIN MANAGEMENT Goal: RH STG PAIN MANAGED AT OR BELOW PT'S PAIN GOAL Description: Less than 3,on 1 to 10 Outcome: Progressing

## 2018-07-02 NOTE — Progress Notes (Signed)
[place] PHYSICAL MEDICINE & REHABILITATION PROGRESS NOTE   Subjective/Complaints: Pt had restless night. Thought it was dinner time. Did not recall seeing me before but remembered seeing my sons  ROS: Limited due to cognitive/behavioral    Objective:   No results found. Recent Labs    07/01/18 0704 07/02/18 0549  WBC 9.4 8.3  HGB 11.2* 12.2*  HCT 32.5* 34.4*  PLT 333 374   Recent Labs    07/01/18 0704 07/02/18 0549  NA 136 136  K 3.5 3.5  CL 103 100  CO2 25 26  GLUCOSE 90 89  BUN 7 <5*  CREATININE 0.53* 0.62  CALCIUM 8.4* 8.6*    Intake/Output Summary (Last 24 hours) at 07/02/2018 0849 Last data filed at 07/01/2018 2008 Gross per 24 hour  Intake 60 ml  Output -  Net 60 ml     Physical Exam: Vital Signs Blood pressure (!) 148/70, pulse 68, temperature 98 F (36.7 C), temperature source Oral, resp. rate 16, weight 60.2 kg, SpO2 100 %. Constitutional: No distress . Vital signs reviewed. Disheveled.  HEENT: EOMI, oral membranes moist Neck: supple Cardiovascular: RRR without murmur. No JVD    Respiratory: CTA Bilaterally without wheezes or rales. Normal effort    GI: BS +, non-tender, non-distended  Musculoskeletal:  General: No edema.  Neurological: Confused. Oriented to self only. Follows simple commands. Very distracted, somewhat delusional.  RUE 3/5 prox to distal. LUE 3+/5 prox to distal. RLE 2 to 2+/5, LLE 3/5 prox to distal. Decreased sensation below shoulders bilaterally but does sense gross touch and some pain sensation. DTR's 1+.  Skin: Skin iswarm. Noerythema. Surgical site remains clean and dry Psychiatric:  Confused and distracted    Assessment/Plan: 1. Functional deficits secondary to C5 myelopathy which require 3+ hours per day of interdisciplinary therapy in a comprehensive inpatient rehab setting.  Physiatrist is providing close team supervision and 24 hour management of active medical problems listed below.  Physiatrist  and rehab team continue to assess barriers to discharge/monitor patient progress toward functional and medical goals  Care Tool:  Bathing              Bathing assist       Upper Body Dressing/Undressing Upper body dressing   What is the patient wearing?: Hospital gown only    Upper body assist Assist Level: Moderate Assistance - Patient 50 - 74%    Lower Body Dressing/Undressing Lower body dressing      What is the patient wearing?: Incontinence brief     Lower body assist Assist for lower body dressing: Total Assistance - Patient < 25%     Toileting Toileting    Toileting assist Assist for toileting: Total Assistance - Patient < 25%(incontient)     Transfers Chair/bed transfer  Transfers assist  Chair/bed transfer activity did not occur: Safety/medical concerns        Locomotion Ambulation   Ambulation assist              Walk 10 feet activity   Assist           Walk 50 feet activity   Assist           Walk 150 feet activity   Assist           Walk 10 feet on uneven surface  activity   Assist           Wheelchair     Assist  Wheelchair 50 feet with 2 turns activity    Assist            Wheelchair 150 feet activity     Assist          Medical Problem List and Plan: 1.Decreased functional mobility with lower extremity weaknesssecondary to C5 incomplete myelopathy. Status post C5-6 decompression and fusion 06/26/2018.  -No brace required per NS, cervical precautions recommended -Patient is beginning CIR therapies today including PT and OT. Added SLP eval d/t ongoing confusion  -suspect confusion related to metabolic or infectious cause vs ETOH delirium. [place] more likely given cortical atrophy on CT. Drug tox screen not perfomed at admit. Probably not worthwhile to do right now.    -today's labs unremarkable. Recent ammonia normal   -check ua,  ucx   -consider librium trial   -speak with family re: baseline 2. Antithrombotics: -DVT/anticoagulation:SCDs. Check vascular study -continue sq lovenox 40 qd -antiplatelet therapy: N/A 3. Pain Management:Tylenol only as needed 4. Mood:Provide emotional support -antipsychotic agents: N/A -pt with baseline cognitive deficits? (see above)    5. Neuropsych: This patientiscapable of making decisions on hisown behalf. 6. Skin/Wound Care:Routine skin checks 7. Fluids/Electrolytes/Nutrition:encourage PO  -I personally reviewed the patient's labs today.   8. Alcohol abuse. Monitor for withdrawal. 9. Tobacco abuse. NicoDerm patch. Provide counseling 10. Hypertension with tachycardia. Low-dose Lopressor 25 mg twice daily. Monitor as he mobilizes with therapies 11. Constipation. Laxative assistance, scheduled senokot-s    LOS: 1 days A FACE TO FACE EVALUATION WAS PERFORMED  Meredith Staggers 07/02/2018, 8:49 AM

## 2018-07-02 NOTE — Progress Notes (Signed)
Occupational Therapy Session Note  Patient Details  Name: Curtis Bowen MRN: [id number] Date of Birth: 28-Nov-1960  Today's Date: 07/02/2018 OT Individual Time: 1230-1300 OT Individual Time Calculation (min): 30 min    Short Term Goals: Week 1:   See OT POC  Skilled Therapeutic Interventions/Progress Updates:    Pt received supine with lunch tray present. Pt required assistance in opening containers and cutting through several food items. Discussed pt's PLOF and goals for OT while in rehab. Facilitated proper positioning for pt to reach lunch. Pt very verbose and unable to attend to eating and talking with therapist simultaneously. Pt given time to eat lunch, 30 min missed.   Therapy Documentation Precautions:  Precautions Precautions: Cervical, Fall Restrictions Weight Bearing Restrictions: No   Therapy/Group: Individual Therapy  Curtis Sites 07/02/2018, 1:05 PM
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
## 2018-07-02 NOTE — Progress Notes (Signed)
Patient noted to be anxious and restless at times. Poor safety awareness noted, confused, requiring frequent redirection. Telesitter initiated this shift, remains in progress. Disrobing, resting in brief intervals throughout the night. CIWA protocol continues, last score 4 at 0600. Pt noted referring to urinal as "a cat", pt reoriented to object, then states "oh it just looks like one". Anxiety and restlessness decreasing as night proceeds. Calm and quiet at present. Given tylenol 650 mg po at 2008 for reports of arthritic pain to knees-effective.

## 2018-07-02 NOTE — Plan of Care (Signed)
  Problem: Consults Goal: RH SPINAL CORD INJURY PATIENT EDUCATION Description:  See Patient Education module for education specifics.  Outcome: Progressing   Problem: SCI BOWEL ELIMINATION Goal: RH STG MANAGE BOWEL WITH ASSISTANCE Description: STG Manage Bowel with min.Assistance. Outcome: Progressing Goal: RH STG SCI MANAGE BOWEL WITH MEDICATION WITH ASSISTANCE Description: STG SCI Manage bowel with medication with min.assistance. Outcome: Progressing   Problem: SCI BLADDER ELIMINATION Goal: RH STG MANAGE BLADDER WITH ASSISTANCE Description: STG Manage Bladder With mod.Assistance Outcome: Progressing   Problem: RH SKIN INTEGRITY Goal: RH STG SKIN FREE OF INFECTION/BREAKDOWN Description: With min. assist Outcome: Progressing Goal: RH STG MAINTAIN SKIN INTEGRITY WITH ASSISTANCE Description: STG Maintain Skin Integrity With min.Assistance. Outcome: Progressing Goal: RH STG ABLE TO PERFORM INCISION/WOUND CARE W/ASSISTANCE Description: STG Able To Perform Incision/Wound Care With min.Assistance. Outcome: Progressing   Problem: RH SAFETY Goal: RH STG ADHERE TO SAFETY PRECAUTIONS W/ASSISTANCE/DEVICE Description: STG Adhere to Safety Precautions With mod.Assistance/Device. Outcome: Progressing   Problem: RH PAIN MANAGEMENT Goal: RH STG PAIN MANAGED AT OR BELOW PT'S PAIN GOAL Description: Less than 3,on 1 to 10 Outcome: Progressing   Problem: RH KNOWLEDGE DEFICIT SCI Goal: RH STG INCREASE KNOWLEDGE OF SELF CARE AFTER SCI Description: Pt. And family able to verbalized safety precautions before discharge Outcome: Progressing

## 2018-07-03 ENCOUNTER — Inpatient Hospital Stay (HOSPITAL_COMMUNITY): Payer: Self-pay | Admitting: Speech Pathology

## 2018-07-03 ENCOUNTER — Inpatient Hospital Stay (HOSPITAL_COMMUNITY): Payer: Self-pay | Admitting: Physical Therapy

## 2018-07-03 ENCOUNTER — Inpatient Hospital Stay (HOSPITAL_COMMUNITY): Payer: Self-pay | Admitting: Occupational Therapy

## 2018-07-03 DIAGNOSIS — G312 Degeneration of nervous system due to alcohol: Secondary | ICD-10-CM

## 2018-07-03 DIAGNOSIS — F1020 Alcohol dependence, uncomplicated: Secondary | ICD-10-CM

## 2018-07-03 DIAGNOSIS — F1010 Alcohol abuse, uncomplicated: Secondary | ICD-10-CM

## 2018-07-03 LAB — VITAMIN B12: Vitamin B-12: 421 pg/mL (ref 180–914)

## 2018-07-03 LAB — PREALBUMIN: Prealbumin: 8 mg/dL — ABNORMAL LOW (ref 18–38)

## 2018-07-03 MED ORDER — MEGESTROL ACETATE 400 MG/10ML PO SUSP
400.0000 mg | Freq: Two times a day (BID) | ORAL | Status: DC
  Administered 2018-07-03 – 2018-07-16 (×27): 400 mg via ORAL
  Filled 2018-07-03 (×27): qty 10

## 2018-07-03 MED ORDER — CHLORDIAZEPOXIDE HCL 5 MG PO CAPS
10.0000 mg | ORAL_CAPSULE | Freq: Two times a day (BID) | ORAL | Status: DC
  Administered 2018-07-03 – 2018-07-05 (×5): 10 mg via ORAL
  Filled 2018-07-03 (×5): qty 2

## 2018-07-03 NOTE — Plan of Care (Signed)
  Problem: RH SKIN INTEGRITY Goal: RH STG SKIN FREE OF INFECTION/BREAKDOWN Description: With min. assist Outcome: Not Progressing; c/o pain left upper arm; warm to touch; Dan PA notified Problem: RH SAFETY Goal: RH STG ADHERE TO SAFETY PRECAUTIONS W/ASSISTANCE/DEVICE Description: STG Adhere to Safety Precautions With mod.Assistance/Device. Outcome: Not Progressing; telesitter Problem: RH KNOWLEDGE DEFICIT SCI Goal: RH STG INCREASE KNOWLEDGE OF SELF CARE AFTER SCI Description: Pt. And family able to verbalized safety precautions before discharge Outcome: Not Progressing; confusion

## 2018-07-03 NOTE — Progress Notes (Deleted)
Patient complained of pain left upper arm; warm to touch. Linna Hoff PA notified

## 2018-07-03 NOTE — Progress Notes (Signed)
[facility] PHYSICAL MEDICINE & REHABILITATION PROGRESS NOTE   Subjective/Complaints: Pt had restless night. Thought it was dinner time. Did not recall seeing me before but remembered seeing my sons  ROS: Limited due to cognitive/behavioral    Objective:   Vas [name] Lower Extremity Venous (dvt)  Result Date: 07/02/2018  Lower Venous Study Indications: Edema.  Risk Factors: Surgery C5-C6 Discectomy 06/26/2018 weakness and gait instability. Performing Technologist: Toma Copier RVS  Examination Guidelines: A complete evaluation includes B-mode imaging, spectral Doppler, color Doppler, and power Doppler as needed of all accessible portions of each vessel. Bilateral testing is considered an integral part of a complete examination. Limited examinations for reoccurring indications may be performed as noted.  +---------+---------------+---------+-----------+----------+-------+ RIGHT    CompressibilityPhasicitySpontaneityPropertiesSummary +---------+---------------+---------+-----------+----------+-------+ CFV      Full           Yes      Yes                          +---------+---------------+---------+-----------+----------+-------+ SFJ      Full                                                 +---------+---------------+---------+-----------+----------+-------+ FV Prox  Full           Yes      Yes                          +---------+---------------+---------+-----------+----------+-------+ FV Mid   Full                                                 +---------+---------------+---------+-----------+----------+-------+ FV DistalFull           Yes      Yes                          +---------+---------------+---------+-----------+----------+-------+ PFV      Full           Yes      Yes                          +---------+---------------+---------+-----------+----------+-------+ POP      Full           Yes      Yes                           +---------+---------------+---------+-----------+----------+-------+ PTV      Full                                                 +---------+---------------+---------+-----------+----------+-------+ PERO     Full                                                 +---------+---------------+---------+-----------+----------+-------+   +---------+---------------+---------+-----------+----------+-------+ LEFT  CompressibilityPhasicitySpontaneityPropertiesSummary +---------+---------------+---------+-----------+----------+-------+ CFV      Full           Yes      Yes                          +---------+---------------+---------+-----------+----------+-------+ SFJ      Full                                                 +---------+---------------+---------+-----------+----------+-------+ FV Prox  Full           Yes      Yes                          +---------+---------------+---------+-----------+----------+-------+ FV Mid   Full                                                 +---------+---------------+---------+-----------+----------+-------+ FV DistalFull           Yes      Yes                          +---------+---------------+---------+-----------+----------+-------+ PFV      Full           Yes      Yes                          +---------+---------------+---------+-----------+----------+-------+ POP      Full           Yes      Yes                          +---------+---------------+---------+-----------+----------+-------+ PTV      Full                                                 +---------+---------------+---------+-----------+----------+-------+ PERO     Full                                                 +---------+---------------+---------+-----------+----------+-------+     Summary: Right: There is no evidence of deep vein thrombosis in the lower extremity. No cystic structure found in the popliteal fossa. Left: There is  no evidence of deep vein thrombosis in the lower extremity. No cystic structure found in the popliteal fossa.  *See table(s) above for measurements and observations.    Preliminary    Recent Labs    07/01/18 0704 07/02/18 0549  WBC 9.4 8.3  HGB 11.2* 12.2*  HCT 32.5* 34.4*  PLT 333 374   Recent Labs    07/01/18 0704 07/02/18 0549  NA 136 136  K 3.5 3.5  CL 103 100  CO2 25 26  GLUCOSE 90 89  BUN 7 <5*  CREATININE 0.53* 0.62  CALCIUM 8.4* 8.6*    Intake/Output  Summary (Last 24 hours) at 07/03/2018 0950 Last data filed at 07/02/2018 1833 Gross per 24 hour  Intake 240 ml  Output 750 ml  Net -510 ml     Physical Exam: Vital Signs Blood pressure (!) 166/71, pulse 77, temperature 98.3 F (36.8 C), temperature source Oral, resp. rate 16, weight 60.2 kg, SpO2 100 %. Constitutional: No distress . Vital signs reviewed. Disheveled.  HEENT: EOMI, oral membranes moist Neck: supple Cardiovascular: RRR without murmur. No JVD    Respiratory: CTA Bilaterally without wheezes or rales. Normal effort    GI: BS +, non-tender, non-distended  Musculoskeletal:  General: No edema.  Neurological: Confused. Oriented to self only. Follows simple commands. Very distracted, somewhat delusional.  RUE 3/5 prox to distal. LUE 3+/5 prox to distal. RLE 2 to 2+/5, LLE 3/5 prox to distal. Decreased sensation below shoulders bilaterally but does sense gross touch and some pain sensation. DTR's 1+.  Skin: Skin iswarm. Noerythema. Surgical site remains clean and dry Psychiatric:  Confused and distracted    Assessment/Plan: 1. Functional deficits secondary to C5 myelopathy which require 3+ hours per day of interdisciplinary therapy in a comprehensive inpatient rehab setting.  Physiatrist is providing close team supervision and 24 hour management of active medical problems listed below.  Physiatrist and rehab team continue to assess barriers to discharge/monitor patient progress toward  functional and medical goals  Care Tool:  Bathing  Bathing activity did not occur: (Completed at bed level) Body parts bathed by patient: Right arm, Left arm, Chest, Abdomen, Right upper leg, Left upper leg, Face   Body parts bathed by helper: Front perineal area, Buttocks, Right lower leg, Left lower leg     Bathing assist       Upper Body Dressing/Undressing Upper body dressing   What is the patient wearing?: Pull over shirt    Upper body assist Assist Level: Minimal Assistance - Patient > 75%    Lower Body Dressing/Undressing Lower body dressing      What is the patient wearing?: Incontinence brief     Lower body assist Assist for lower body dressing: Total Assistance - Patient < 25%     Toileting Toileting    Toileting assist Assist for toileting: Total Assistance - Patient < 25%     Transfers Chair/bed transfer  Transfers assist  Chair/bed transfer activity did not occur: Safety/medical concerns  Chair/bed transfer assist level: 2 Helpers     Locomotion Ambulation   Ambulation assist   Ambulation activity did not occur: Safety/medical concerns          Walk 10 feet activity   Assist  Walk 10 feet activity did not occur: Safety/medical concerns        Walk 50 feet activity   Assist Walk 50 feet with 2 turns activity did not occur: Safety/medical concerns         Walk 150 feet activity   Assist Walk 150 feet activity did not occur: Safety/medical concerns         Walk 10 feet on uneven surface  activity   Assist Walk 10 feet on uneven surfaces activity did not occur: Safety/medical concerns         Wheelchair     Assist Will patient use wheelchair at discharge?: (TBD)   Wheelchair activity did not occur: Safety/medical concerns         Wheelchair 50 feet with 2 turns activity    Assist    Wheelchair 50 feet with 2 turns activity did not  occur: Safety/medical concerns       Wheelchair 150 feet  activity     Assist Wheelchair 150 feet activity did not occur: Safety/medical concerns        Medical Problem List and Plan: 1.Decreased functional mobility with lower extremity weaknesssecondary to C5 incomplete myelopathy. Status post C5-6 decompression and fusion 06/26/2018.  -No brace required per NS, cervical precautions recommended --Continue CIR therapies including PT, OT, and SLP   -confusion likely related to   ETOH use/ delirium.    -appreciate SLP input. Apparently heavy drinker pta, cognitive decline pta   -ua equivocal, ucx pending   -will add low dose librium 2. Antithrombotics: -DVT/anticoagulation:SCDs. Check vascular study -continue sq lovenox 40 qd -antiplatelet therapy: N/A 3. Pain Management:Tylenol only as needed 4. Mood:Provide emotional support -antipsychotic agents: N/A -pt with baseline cognitive deficits     5. Neuropsych: This patientiscapable of making decisions on hisown behalf. 6. Skin/Wound Care:Routine skin checks 7. Fluids/Electrolytes/Nutrition:encourage PO     -intake borderline to poor     -cognitive component  -megace trial  -thiamine, folic acid (check levels tomorrow, too) 8. Alcohol abuse. Monitor for withdrawal. 9. Tobacco abuse. NicoDerm patch. Provide counseling 10. Hypertension with tachycardia. Low-dose Lopressor 25 mg twice daily. Monitor as he mobilizes with therapies 11. Constipation. Laxative assistance, scheduled senokot-s,. Not eating much yet    LOS: 2 days A FACE TO FACE EVALUATION WAS PERFORMED  Ranelle OysterZachary T [place] 07/03/2018, 9:50 AM

## 2018-07-03 NOTE — Progress Notes (Signed)
Occupational Therapy Session Note  Patient Details  Name: Curtis Bowen MRN: [id number] Date of Birth: 1960-11-07  Today's Date: 07/03/2018 OT Individual Time: 3354-5625 OT Individual Time Calculation (min): 60 min    Short Term Goals: Week 1:  OT Short Term Goal 1 (Week 1): Pt will consistently complete basic transfers with mod A using LRAD in order to reduce caregiver burden OT Short Term Goal 2 (Week 1): Pt will consistently be oriented to situation with min cuing throughout 60 minute session OT Short Term Goal 3 (Week 1): Pt will don pants with min A using LRAD.  Skilled Therapeutic Interventions/Progress Updates:    Pt seen for OT session focusing on functional transfers and attention to task. Pt sitting EOB upon arrival with RN and NT present about to assist pt to toilet, hand off to OT. Pt also receiving pain meds upon arrival with complaints of nerve pain. He completed sliding board transfer EOB> w/c with min-mod A with +2 for safety. Sliding board transfer to [facility] over toilet with max A and +2 to stabilize equipment. LAteral leans completed with min-mod A to attempt clothing management and hygiene. Ultimately required, +2 total A clothing management and hygiene to be compelted with pt coming into modified stand/squat with one person and +2 for clothing management. +2 sliding board transfer from [facility] to w/c. Completed hand hygiene at sink with set-up and supervision/VCs. Pt with such poor attention to task, he requires VCs to end task as he continues to wash hands while telling therapist story, lasting several minutes and cuing required to cease task.  Pt desiring to eat breakfast this session. Environment set-up to limit distractions, one food item presented at time, food already cut and with only one utensil. Pt still distracted by external factors and requiring significantly increased time to eat small amount of food.  Pt transferred back to bed at end of session with mod A. New pants  donned in supine, rolling with min A to pull pants.  Pt left sitting up in bed at end of session, set-up with meal tray and all  Needs in reach.  Pt not oriented to place or situation, failed attempted to re-orient throughout session. Pt unable to recall completing sliding board transfers yesterday. He was hyperverbal throughout session and perseverative on his dad's behavior towards him in childhood. CSW made aware of need for neuro-psych consult.   Therapy Documentation Precautions:  Precautions Precautions: Cervical, Fall Precaution Booklet Issued: No Restrictions Weight Bearing Restrictions: No   Therapy/Group: Individual Therapy  Ramisa Duman L 07/03/2018, 7:00 AM

## 2018-07-03 NOTE — Progress Notes (Signed)
Physical Therapy Session Note  Patient Details  Name: Curtis Bowen MRN: [id number] Date of Birth: [birth year]  Today's Date: 07/03/2018 PT Individual Time: 1300-1410 PT Individual Time Calculation (min): 70 min   Short Term Goals: Week 1:  PT Short Term Goal 1 (Week 1): Pt will complete least restrictive transfer with mod A consistently PT Short Term Goal 2 (Week 1): Pt will initiate w/c mobility PT Short Term Goal 3 (Week 1): Pt will recall 2/3 cervical precautions  Skilled Therapeutic Interventions/Progress Updates:    Pt received seated in bed working on eating lunch. Pt reports back pain, not rated and requests pain medication from RN. Pt has no pain medication due, declines any other intervention. Pt reports urge to urinate, requests to use toilet and not urinal. Slide board transfer bed to w/c with mod A. Slide board transfer w/c to/from [facility] over toilet seat with mod A. Dependent for clothing management. Pt unable to void once seated on commode due to pain in thighs on seat. Pt is able to use urinal with setup A. Slide board transfer back to bed with mod A. Rolling L/R with min A for dependent donning of brief. Pt is able to bridge in supine to pull pants up over bottom. Pt declines to return to w/c once seated in bed again. Pt agreeable to bed-level exercises. Long-sitting HS stretch and circle-sitting low back stretch. Long-sitting BLE AAROM therex x 10 reps: heel slides, hip abd before pt reports he is done and needs a break. Pt also requesting something to work on hand strength with, provided pt with level green foam and demonstrated how to perform grip strengthening tasks with foam. Pt exhibits poor awareness of deficits and reasoning behind UE and LE N/T and weakness despite education. Pt believes himself to be in a research facility, unable to reorient pt to place and situation throughout session. Pt also perseverates on numb feeling in hands throughout session. Pt makes several  inappropriate comments towards therapist but is able to be redirected to focus on therapy tasks.  Pt left semi-reclined in bed with needs in reach, bed alarm in place, telesitter present at end of session.  Therapy Documentation Precautions:  Precautions Precautions: Cervical, Fall Precaution Booklet Issued: No Restrictions Weight Bearing Restrictions: No     Therapy/Group: Individual Therapy   Excell Seltzer, PT, DPT  07/03/2018, 3:44 PM

## 2018-07-03 NOTE — Progress Notes (Signed)
Speech Language Pathology Daily Session Note  Patient Details  Name: Curtis Bowen MRN: [id number] Date of Birth: 1960-02-08  Today's Date: 07/03/2018 SLP Individual Time: 1100-1150 SLP Individual Time Calculation (min): 50 min and Today's Date: 07/03/2018 SLP Missed Time: 10 Minutes Missed Time Reason: (patient talking on the phone)  Short Term Goals: Week 1: SLP Short Term Goal 1 (Week 1): Pt will utilize external aids to recall orientation information in 6 out of 10 opportunties with Min A cues. SLP Short Term Goal 2 (Week 1): Pt will sustain attention to basic familiar task for 5 minutes with Mod A cues. SLP Short Term Goal 3 (Week 1): Pt will demonstrate intellectual awareness by answering yes/no questions in 7 out of 10 opportunities with Mod A cues.  Skilled Therapeutic Interventions: Skilled treatment session focused on cognitive goals. SLP administered the MoCA-BLIND due to upper extremity weakness and scored 11/22 points with a score of 18 or above considered normal. Patient demonstrates deficits in short-term recall, attention and orientation. Patient was able to demonstrate selective attention to task while cutting his pancake/eating his breakfast that he has been working on the entire morning. Patient received a telephone call and did not end the call despite encouragement, therefore, patient missed remaining 10 mins of session. Patient left upright in bed with alarm on and all needs within reach. Continue with current plan of care.      Pain Pain Assessment Pain Scale: 0-10 Pain Score: 2  Pain Type: Acute pain Pain Location: Back Pain Descriptors / Indicators: Aching Pain Onset: On-going Pain Intervention(s): Medication (See eMAR)  Therapy/Group: Individual Therapy  Curtis Bowen 07/03/2018, 12:24 PM

## 2018-07-04 ENCOUNTER — Inpatient Hospital Stay (HOSPITAL_COMMUNITY): Payer: Self-pay | Admitting: Speech Pathology

## 2018-07-04 ENCOUNTER — Inpatient Hospital Stay (HOSPITAL_COMMUNITY): Payer: Self-pay | Admitting: Occupational Therapy

## 2018-07-04 ENCOUNTER — Inpatient Hospital Stay (HOSPITAL_COMMUNITY): Payer: Self-pay | Admitting: Physical Therapy

## 2018-07-04 LAB — URINE CULTURE: Culture: >=100000 — AB

## 2018-07-04 LAB — FOLATE RBC
Folate, Hemolysate: 355 ng/mL
Folate, RBC: 1026 ng/mL (ref >498)
Hematocrit: 34.6 % — ABNORMAL LOW (ref 37.5–51.0)

## 2018-07-04 MED ORDER — CEPHALEXIN 250 MG PO CAPS
250.0000 mg | ORAL_CAPSULE | Freq: Three times a day (TID) | ORAL | Status: DC
  Administered 2018-07-04 – 2018-07-09 (×16): 250 mg via ORAL
  Filled 2018-07-04 (×16): qty 1

## 2018-07-04 NOTE — Progress Notes (Signed)
Occupational Therapy Session Note  Patient Details  Name: Curtis Bowen MRN: [id number] Date of Birth: 03/03/1960  Today's Date: 07/04/2018 OT Individual Time: 1000-1015 OT Individual Time Calculation (min): 15 min    Short Term Goals: Week 1:  OT Short Term Goal 1 (Week 1): Pt will consistently complete basic transfers with mod A using LRAD in order to reduce caregiver burden OT Short Term Goal 2 (Week 1): Pt will consistently be oriented to situation with min cuing throughout 60 minute session OT Short Term Goal 3 (Week 1): Pt will don pants with min A using LRAD.  Skilled Therapeutic Interventions/Progress Updates:    Patient in bed, significant confusion noted.  He is not oriented to place or situation.  He is unable to use remote control after orientation to proper use.  Tray is set up for breakfast, he states that he is going to eat, spilling cup of milk but declines assistance.  He is refusing to move to edge of bed or complete transfer to improve positioning.  Multiple attempts to provide options for therapy session.  Patient continues to state that he has to stay in bed for his wife's visit and the [place] race on TV.  Patient missed 45 minutes of session.  Will attempt at a later time. He remains in bed with tele-sitter and bed alarm set.    Therapy Documentation Precautions:  Precautions Precautions: Cervical, Fall Precaution Booklet Issued: No Restrictions Weight Bearing Restrictions: No General: General OT Amount of Missed Time: 45 Minutes Vital Signs:   Pain: Pain Assessment Pain Scale: 0-10 Pain Score: 0-No pain Pain Type: Acute pain Pain Location: Head Pain Orientation: Anterior Pain Descriptors / Indicators: Aching Pain Frequency: Occasional Pain Onset: Gradual Patients Stated Pain Goal: 0 Pain Intervention(s): Medication (See eMAR) Multiple Pain Sites: No   Other Treatments:     Therapy/Group: Individual Therapy  Carlos Levering 07/04/2018, 10:32  AM

## 2018-07-04 NOTE — Progress Notes (Signed)
Pts black cell phone is in a ziplock bag on the bedside table.

## 2018-07-04 NOTE — Progress Notes (Signed)
Physical Therapy Session Note  Patient Details  Name: Curtis Bowen MRN: [id number] Date of Birth: October 01, 1960  Today's Date: 07/04/2018 PT Individual Time: 1415-1525 PT Individual Time Calculation (min): 70 min   Short Term Goals: Week 1:  PT Short Term Goal 1 (Week 1): Pt will complete least restrictive transfer with mod A consistently PT Short Term Goal 2 (Week 1): Pt will initiate w/c mobility PT Short Term Goal 3 (Week 1): Pt will recall 2/3 cervical precautions  Skilled Therapeutic Interventions/Progress Updates:    Pt received seated in w/c in room with no socks or shirt on with safety belt detached. Pt perseverating on reaching his wife Arbie Cookey. Pt's cell phone has a dead battery so assisted pt with calling his wife from hospital phone. Pt believes he is in the middle of moving back to the [place] area from [place] and while on the phone call with his wife perseverates on needing to take care of moving. Pt is able to be redirected by his wife and by therapist that he is in the hospital and needs to participate in therapy session so that he can regain strength and return home. Pt is dependent to don socks and setup A to don a shirt. Manual w/c propulsion x 100 ft with use of BUE and min A for steering. Slide board transfer w/c to mat table with mod A. Sit to stand x 5 reps from elevated mat table to stedy with pillow in front of knees for improved comfort. Pt complains of burning in calves in standing and that they feel "tight as banjo strings". Seated B gastroc stretch 3 x 60 each. Seated B ankle AROM in all available planes of motion to tolerance, pt reports burning sensation on dorsum of foot with ankle mobility. Pt also noted to have edema in BLE. Pt is dependent to don TED hose. Slide board transfer mat table to w/c with min A downhill. Pt frequently disoriented to place and situation throughout session but is pleasantly confused and able to be redirected to therapy tasks. Pt left  seated in w/c in room with needs in reach at end of session, quick release belt and chair alarm in place, telesitter present.   Therapy Documentation Precautions:  Precautions Precautions: Cervical, Fall Precaution Booklet Issued: No Restrictions Weight Bearing Restrictions: No    Therapy/Group: Individual Therapy   Excell Seltzer, PT, DPT  07/04/2018, 4:00 PM
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
## 2018-07-04 NOTE — IPOC Note (Signed)
Overall Plan of Care [facility]) Patient Details Name: Curtis Bowen MRN: [id number] DOB: 1960/10/01  Admitting Diagnosis: <principal problem not specified>  Hospital Problems: Active Problems:   Cervical myelopathy ([place])     Functional Problem List: Nursing Bladder, Endurance, Motor, Nutrition, Skin Integrity, Safety  PT Balance, Behavior, Motor, Pain, Perception, Safety, Sensory  OT Cognition, Behavior, Balance, Safety, Sensory, Skin Integrity, Endurance, Motor, Pain, Perception  SLP Cognition, Safety, Perception  TR         Basic ADL's: OT Eating, Grooming, Bathing, Dressing, Toileting     Advanced  ADL's: OT       Transfers: PT Bed Mobility, Bed to Chair, Car, [employer], Floor  OT Toilet     Locomotion: PT Ambulation, Emergency planning/management officer, Stairs     Additional Impairments: OT None  SLP Social Cognition   Problem Solving, Memory, Attention, Awareness  TR      Anticipated Outcomes Item Anticipated Outcome  Self Feeding Indep.  Swallowing      Basic self-care  Supervision  Toileting  Supervision   Bathroom Transfers Supervision  Bowel/Bladder  Continent to bowel and bladder with mod. assist.  Transfers  close Supervision  Locomotion  Supervision with LRAD  Communication     Cognition  Min A with basic  Pain  Less than 3,on 1 to 10 scale  Safety/Judgment  Free from falls during his stay in rehab.   Therapy Plan: PT Intensity: Minimum of 1-2 x/day ,45 to 90 minutes PT Frequency: 5 out of 7 days PT Duration Estimated Length of Stay: 18-21 days OT Intensity: Minimum of 1-2 x/day, 45 to 90 minutes OT Frequency: 5 out of 7 days OT Duration/Estimated Length of Stay: 18-21 days SLP Intensity: Minumum of 1-2 x/day, 30 to 90 minutes SLP Frequency: 3 to 5 out of 7 days SLP Duration/Estimated Length of Stay: 3 weeks   Due to the current state of emergency, patients may not be receiving their 3-hours of Medicare-mandated therapy.   Team  Interventions: Nursing Interventions Patient/Family Education, Bladder Management, Pain Management, Skin Care/Wound Management  PT interventions Ambulation/gait training, Training and development officer, Community reintegration, Discharge planning, DME/adaptive equipment instruction, Functional mobility training, Neuromuscular re-education, Pain management, Patient/family education, Psychosocial support, Stair training, Therapeutic Activities, Therapeutic Exercise, UE/LE Strength taining/ROM, UE/LE Coordination activities, Wheelchair propulsion/positioning  OT Interventions Balance/vestibular training, Discharge planning, Pain management, Self Care/advanced ADL retraining, Therapeutic Activities, UE/LE Coordination activities, Therapeutic Exercise, Skin care/wound managment, Patient/family education, Functional mobility training, Disease mangement/prevention, Cognitive remediation/compensation, DME/adaptive equipment instruction, Psychosocial support, Neuromuscular re-education, Splinting/orthotics, UE/LE Strength taining/ROM, Wheelchair propulsion/positioning  SLP Interventions Functional tasks, Patient/family education  TR Interventions    SW/CM Interventions Discharge Planning, Psychosocial Support, Patient/Family Education   Barriers to Discharge MD  Medical stability  Nursing      PT Medical stability, Home environment access/layout, Behavior    OT Home environment access/layout, Behavior Unsure of d/c disposition and assist at d/c. [facility] notes unclear  SLP      SW       Team Discharge Planning: Destination: PT-Home ,OT- Home , SLP-Home Projected Follow-up: PT-Home health PT, OT-  Home health OT, SLP-None Projected Equipment Needs: PT-To be determined, OT- To be determined, SLP-None recommended by SLP Equipment Details: PT-TBD pending progress, OT-  Patient/family involved in discharge planning: PT- Patient,  OT-Patient, SLP-Patient, Family member/caregiver  MD ELOS: 18-21 days Medical  Rehab Prognosis:  Excellent Assessment: The patient has been admitted for CIR therapies with the diagnosis of cervical myelopathy, etoh encephalopath. The team will be addressing functional  mobility, strength, stamina, balance, safety, adaptive techniques and equipment, self-care, bowel and bladder mgt, patient and caregiver education, NMR, post-op precautions, pain control, cognition. Goals have been set at supervision for self-care and mobility and min assist for cognition and communication.   Due to the current state of emergency, patients may not be receiving their 3 hours per day of Medicare-mandated therapy.    Ranelle OysterZachary T. Swartz, MD, FAAPMR      See Team Conference Notes for weekly updates to the plan of care
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
## 2018-07-04 NOTE — Progress Notes (Signed)
Speech Language Pathology Daily Session Note  Patient Details  Name: Curtis Bowen MRN: [id number] Date of Birth: May 07, 1960  Today's Date: 07/04/2018 SLP Individual Time: 0815-0900 SLP Individual Time Calculation (min): 45 min  Short Term Goals: Week 1: SLP Short Term Goal 1 (Week 1): Pt will utilize external aids to recall orientation information in 6 out of 10 opportunties with Min A cues. SLP Short Term Goal 2 (Week 1): Pt will sustain attention to basic familiar task for 5 minutes with Mod A cues. SLP Short Term Goal 3 (Week 1): Pt will demonstrate intellectual awareness by answering yes/no questions in 7 out of 10 opportunities with Mod A cues.  Skilled Therapeutic Interventions:  Skilled treatment session focused on cognition goals. SLP received pt upright in bed with breakfast in front of him. Pt was very distracted and confused by items on breakfast tray. SLP facilitated attention to only items related to breakfast by removing all items from table,  Including phone and call light. Pt continued to be distracted from task of eating by bedding and continued to move his bacon back and forth from his grits to his plate. Pt observed talking to people who weren't present in room, stating that he had just purchased the bed he was in and talking about "vermen" in the room. SLP provided redirection to orientation about location and redirection to task. Pt continued to be difficult to redirect to task and stated that he wasn't "going to do any thing today!." Education provided to NT on ways to provide redirection and orientation to pt.   Pt doesn't demonstrate any ability to retain information.   Pain Pain Assessment Pain Scale: 0-10 Pain Score: 0-No pain  Therapy/Group: Individual Therapy  Jet Armbrust 07/04/2018, 2:18 PM

## 2018-07-04 NOTE — Progress Notes (Signed)
[facility] Individual Statement of Services  Patient Name:  Verle Wheeling  Date:  07/04/2018  Welcome to the [facility].  Our goal is to provide you with an individualized program based on your diagnosis and situation, designed to meet your specific needs.  With this comprehensive rehabilitation program, you will be expected to participate in at least 3 hours of rehabilitation therapies Monday-Friday, with modified therapy programming on the weekends.  Your rehabilitation program will include the following services:  Physical Therapy (PT), Occupational Therapy (OT), Speech Therapy (ST), 24 hour per day rehabilitation nursing, Neuropsychology, Case Management (Social Worker), Rehabilitation Medicine, Nutrition Services and Pharmacy Services  Weekly team conferences will be held on Tuesdays to discuss your progress.  Your Social Worker will talk with you frequently to get your input and to update you on team discussions.  Team conferences with you and your family in attendance may also be held.  Expected length of stay: 18 to 21 days  Overall anticipated outcome: supervision with min A for car transfers and cognition  Depending on your progress and recovery, your program may change. Your Social Worker will coordinate services and will keep you informed of any changes. Your Social Worker's name and contact numbers are listed  below.  The following services may also be recommended but are not provided by the [facility] will be made to provide these services after discharge if needed.  Arrangements include referral to agencies that provide these services.  Your insurance has been verified to be:  Medicare Your primary doctor is:  You were unsure, so Sonia Baller will follow up you and your wife about this closer to  discharge.  Pertinent information will be shared with your doctor and your insurance company.  Social Worker:  Alfonse Alpers, LCSW  [phone] or [phone]  Information discussed with and copy given to patient by: Trey Sailors, 07/04/2018, 7:53 PM

## 2018-07-05 MED ORDER — QUETIAPINE FUMARATE 50 MG PO TABS
50.0000 mg | ORAL_TABLET | Freq: Every day | ORAL | Status: DC
  Administered 2018-07-05 – 2018-07-07 (×3): 50 mg via ORAL
  Filled 2018-07-05 (×3): qty 1

## 2018-07-05 MED ORDER — CHLORDIAZEPOXIDE HCL 5 MG PO CAPS
10.0000 mg | ORAL_CAPSULE | Freq: Three times a day (TID) | ORAL | Status: DC
  Administered 2018-07-05 – 2018-07-08 (×11): 10 mg via ORAL
  Filled 2018-07-05 (×11): qty 2

## 2018-07-05 NOTE — Plan of Care (Signed)
  Problem: SCI BLADDER ELIMINATION Goal: RH STG MANAGE BLADDER WITH ASSISTANCE Description: STG Manage Bladder With mod.Assistance Outcome: Not Progressing   Problem: RH SAFETY Goal: RH STG ADHERE TO SAFETY PRECAUTIONS W/ASSISTANCE/DEVICE Description: STG Adhere to Safety Precautions With mod.Assistance/Device. Outcome: Not Progressing; confusion   Problem: RH KNOWLEDGE DEFICIT SCI Goal: RH STG INCREASE KNOWLEDGE OF SELF CARE AFTER SCI Description: Pt. And family able to verbalized safety precautions before discharge Outcome: Not Progressing; confusion

## 2018-07-05 NOTE — Progress Notes (Signed)
Pt demonstrating increased confusion and hallucinations, talking to himself and others that he thinks is in the room. Pt did not sleep through out the night, claiming "the spirits in the room kept him from sleeping" and " the witch craft is to  Powerful and it is controlling him". Pt continues to take clothes off and be naked b/c that is "what the spirits want him to do". Pt is A & O x1. All VSS, and no other complaints at this time. Will notify provider.
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
## 2018-07-06 ENCOUNTER — Inpatient Hospital Stay (HOSPITAL_COMMUNITY): Payer: Self-pay | Admitting: Occupational Therapy

## 2018-07-06 ENCOUNTER — Inpatient Hospital Stay (HOSPITAL_COMMUNITY): Payer: Self-pay | Admitting: Physical Therapy

## 2018-07-06 ENCOUNTER — Inpatient Hospital Stay (HOSPITAL_COMMUNITY): Payer: Self-pay

## 2018-07-06 MED ORDER — SORBITOL 70 % SOLN
30.0000 mL | Status: AC
  Administered 2018-07-06: 13:00:00 30 mL via ORAL
  Filled 2018-07-06: qty 30
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
## 2018-07-06 NOTE — Progress Notes (Signed)
Patient has not been eating takes bites and sips but keep fidgeting; Sorbitol given per order. Patient still confused.

## 2018-07-06 NOTE — Progress Notes (Signed)
Pt stated he needed to have a BM. This nurse and tech used the slide board to the [facility]. Pt was able to help about 25% during the transfer, and c/o extreme hand and leg pain. Pt was unable to have BM after sitting on BSC for 10 mins. Pt was transferred back to bed with max assist. Pt continues to take adult brief and gown off. Pt stated he was still eating dinner, and did not want his tray to be taken, at this time pt has ate <25%. Pt is very confused and disoriented. Only oriented to self. Will continue to monitor.

## 2018-07-06 NOTE — Progress Notes (Signed)
Occupational Therapy Session Note  Patient Details  Name: Curtis Bowen MRN: [id number] Date of Birth: Jul 26, 1960  Today's Date: 07/06/2018 OT Individual Time: 1430-1530 OT Individual Time Calculation (min): 60 min    Short Term Goals: Week 1:  OT Short Term Goal 1 (Week 1): Pt will consistently complete basic transfers with mod A using LRAD in order to reduce caregiver burden OT Short Term Goal 2 (Week 1): Pt will consistently be oriented to situation with min cuing throughout 60 minute session OT Short Term Goal 3 (Week 1): Pt will don pants with min A using LRAD.  Skilled Therapeutic Interventions/Progress Updates:    Pt received at nurses desk confused but agreeable to therapy. Pt completed dynavision seated in w/c to improve BUE functional reaching/endurance. Pt was returned to room and he completed grooming task at sink. Pt reported his R hand was going numb and required several rest breaks to brush hair thoroughly. Pt completed UB bathing seated, washing same body part multiple times and requiring vc for termination of task. Pt completed slideboard transfer back to bed with mod A, however transfer was incredibly unsafe and required max therapist intervention to correct. Pt able to complete bed mobility to pull self up with bed in trend with min A and max cueing. Pt completed rolling R and L with CGA-min A for brief change. Mod A to wash buttocks at bed level. Skin breakdown observed at sacrum and RN alerted. Pink foam pad applied. Pt able to answer orientation question correctly throughout session however perseverated on receiving a beer by the end. Pt left supine with lunch set up in front of him and cut up. Bed alarm set.   Therapy Documentation Precautions:  Precautions Precautions: Cervical, Fall Precaution Booklet Issued: No Restrictions Weight Bearing Restrictions: No   Therapy/Group: Individual Therapy  Curtis Sites 07/06/2018, 7:32 AM

## 2018-07-06 NOTE — Plan of Care (Signed)
  Problem: SCI BOWEL ELIMINATION Goal: RH STG MANAGE BOWEL WITH ASSISTANCE Description: STG Manage Bowel with min.Assistance. Outcome: Not Progressing; constipatipation LBM 6/25   Problem: RH KNOWLEDGE DEFICIT SCI Goal: RH STG INCREASE KNOWLEDGE OF SELF CARE AFTER SCI Description: Pt. And family able to verbalized safety precautions before discharge Outcome: Not Progressing; confusion
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
## 2018-07-07 ENCOUNTER — Inpatient Hospital Stay (HOSPITAL_COMMUNITY): Payer: Self-pay | Admitting: Physical Therapy

## 2018-07-07 ENCOUNTER — Inpatient Hospital Stay (HOSPITAL_COMMUNITY): Payer: Self-pay | Admitting: Speech Pathology

## 2018-07-07 ENCOUNTER — Inpatient Hospital Stay (HOSPITAL_COMMUNITY): Payer: Self-pay | Admitting: Occupational Therapy

## 2018-07-07 ENCOUNTER — Encounter (HOSPITAL_COMMUNITY): Payer: Self-pay | Admitting: Psychology

## 2018-07-07 DIAGNOSIS — G9340 Encephalopathy, unspecified: Secondary | ICD-10-CM

## 2018-07-07 MED ORDER — METHYLPHENIDATE HCL 5 MG PO TABS
5.0000 mg | ORAL_TABLET | Freq: Two times a day (BID) | ORAL | Status: DC
  Administered 2018-07-07 – 2018-07-11 (×8): 5 mg via ORAL
  Filled 2018-07-07 (×9): qty 1
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
## 2018-07-07 NOTE — Progress Notes (Signed)
Physical Therapy Session Note  Patient Details  Name: Arno Cullers MRN: [id number] Date of Birth: 24-Jan-1960  Today's Date: 07/07/2018 PT Individual Time: 1310-1340 PT Individual Time Calculation (min): 30 min   Short Term Goals: Week 1:  PT Short Term Goal 1 (Week 1): Pt will complete least restrictive transfer with mod A consistently PT Short Term Goal 2 (Week 1): Pt will initiate w/c mobility PT Short Term Goal 3 (Week 1): Pt will recall 2/3 cervical precautions  Skilled Therapeutic Interventions/Progress Updates:    Pt agreeable to makeup therapy time, no complaints of pain but intermittent reports of numbness in fingers. Pt received seated in bed, reports urge to have a bowel movement, agreeable to get up to bedside commode. Supine to sit with min A. Slide board transfer bed to commode with min A going downhill. Once seated on commode pt reports it is pinching his thighs and he declines to attempt toileting on commode. Slide board transfer back to bed with min A going downhill. Sit to supine min A. Pt declines to use a bedpan at this time for toileting. However, when therapist goes to leave pt requests bedpan be removed, informed pt that he is not currently on a bedpan. Pt left semi-reclined in bed with needs in reach, bed alarm in place, telesitter present at end of session.  Therapy Documentation Precautions:  Precautions Precautions: Cervical, Fall(no brace required) Precaution Booklet Issued: No Restrictions Weight Bearing Restrictions: No    Therapy/Group: Individual Therapy   Excell Seltzer, PT, DPT  07/07/2018, 3:08 PM

## 2018-07-07 NOTE — Progress Notes (Signed)
Occupational Therapy Session Note  Patient Details  Name: Curtis Bowen MRN: [id number] Date of Birth: 09/02/1960  Today's Date: 07/07/2018 OT Individual Time: 5027-7412 OT Individual Time Calculation (min): 72 min    Short Term Goals: Week 1:  OT Short Term Goal 1 (Week 1): Pt will consistently complete basic transfers with mod A using LRAD in order to reduce caregiver burden OT Short Term Goal 2 (Week 1): Pt will consistently be oriented to situation with min cuing throughout 60 minute session OT Short Term Goal 3 (Week 1): Pt will don pants with min A using LRAD.  Skilled Therapeutic Interventions/Progress Updates:    Pt seen for OT session focusing on ADL re-training and orientation/ basic cognition. Pt in supine upon arrival, stating he was in hospital, however, was confused throughout session, did not know how long he had been here, stating he was out late last night playing cards, drinking beer etc.  He donned pants in long sitting with max-total A using UEs to manage LEs. Rolled with min A for therapist to pull pants up. Shirt donned with set-up. He transferred to sitting EOB with mod A and VCs for sequencing/ technique. Complaints of B LEs pain, described as "burning". Red marks noted on back of B calfs. RN made aware and assessed during session as well as delivered pain medication. Completed mod-max A sliding board transfer to w/c.  He completed grooming tasks from w/c level at sink. Dropped comb often, stated he was unable to feel the comb in his hand.  W/c gloves donned total A, displays impaired proprioception in B hands, unable to tell if fingers were oriented into gloves correctly or not. He self propelled w/c to therapy day room with supervision and increased time.  He ate breakfast at high/low table. Total A for set-up in order to decrease distractions, however, pt still distracted by internal and external factors requiring frequent redirection to task and significantly  increased time to eat breakfast.  Pt left seated in therapy day room finishing breakfast with hand off to SLP.   Therapy Documentation Precautions:  Precautions Precautions: Cervical, Fall(no brace required) Precaution Booklet Issued: No Restrictions Weight Bearing Restrictions: No   Therapy/Group: Individual Therapy  Vy Badley L 07/07/2018, 7:01 AM

## 2018-07-07 NOTE — Progress Notes (Signed)
Speech Language Pathology Daily Session Note  Patient Details  Name: Curtis Bowen MRN: [id number] Date of Birth: 12/30/1960  Today's Date: 07/07/2018 SLP Individual Time: 8295-6213 SLP Individual Time Calculation (min): 60 min  Short Term Goals: Week 1: SLP Short Term Goal 1 (Week 1): Pt will utilize external aids to recall orientation information in 6 out of 10 opportunties with Min A cues. SLP Short Term Goal 2 (Week 1): Pt will sustain attention to basic familiar task for 5 minutes with Mod A cues. SLP Short Term Goal 3 (Week 1): Pt will demonstrate intellectual awareness by answering yes/no questions in 7 out of 10 opportunities with Mod A cues.  Skilled Therapeutic Interventions:  Skilled treatment session focused on cognition goals. SLP received pt from OT in dayroom with pt self-feeding breakfast. OT had removed all items from table (except plate and cup) to reduce distractions and promote timely self-feeding. SLP also explained to pt, that she would be talking with pt while he ate. Despite all of these acommodations, pt is verbose and internally distracted resulting in prolong length of time to eat. After eating, SLP facilitated session by providing basic problem solving task of WAR. Pt continues to be verbose about off-topic ideas as well as comments that indicate pt has not insight into deficits or rationale for being in hospital. PT was returned to room, left upright in wheelchair, lap belt alarm on and all needs within reach.      Pain    Therapy/Group: Individual Therapy  Curtis Bowen 07/07/2018, 9:42 AM

## 2018-07-07 NOTE — Progress Notes (Signed)
Physical Therapy Session Note  Patient Details  Name: Curtis Bowen MRN: [id number] Date of Birth: 1960/11/29  Today's Date: 07/07/2018 PT Individual Time: 1100-1155 PT Individual Time Calculation (min): 55 min   Short Term Goals: Week 1:  PT Short Term Goal 1 (Week 1): Pt will complete least restrictive transfer with mod A consistently PT Short Term Goal 2 (Week 1): Pt will initiate w/c mobility PT Short Term Goal 3 (Week 1): Pt will recall 2/3 cervical precautions  Skilled Therapeutic Interventions/Progress Updates:    Pt received seated in bed with pants down and brief off, bedpan on bed. Pt reports he was attempting to have a bowel movement but no longer has the urge. Pt also reports having a fall earlier this AM and that the doctor had to help him off of the floor, no documentation as to this event actually occuring. Pt also remains confused throughout session and believes himself to be at home. Attempted to reorient patient to place and situation without success. Rolling L/R with min A and use of bedrails for dependent donning of new brief and pants. Attempt to have pt come to sitting EOB, pt reports urge to have a bowel movement again. Rolling L/R with min A and use of bedrails for dependent doffing of brief and pants and placement of bedpan. Pt wishes to brush his teeth while seated on bedpan. Pt is setup A to brush teeth, unable to open plastic bag containing toothbrush due to decreased fine motor control in hands. Pt intermittently complains of burning and tingling sensation in fingers and BLE. Pt is dependent to don TED hose for edema management. Pt left seated on bedpan in bed with call button in reach, telesitter present.  Therapy Documentation Precautions:  Precautions Precautions: Cervical, Fall(no brace required) Precaution Booklet Issued: No Restrictions Weight Bearing Restrictions: (P) No    Therapy/Group: Individual Therapy   Excell Seltzer, PT, DPT  07/07/2018,  12:19 PM

## 2018-07-07 NOTE — Consult Note (Signed)
Neuropsychological Consultation   Patient:   Keshon Markovitz   DOB:   May 19, 1960  MR Number:  [id number]  Location:  [facility] [id number] [place] [address] Dept: [place]: [id number]           Date of Service:   07/07/2018  Start Time:   10 AM End Time:   11 AM  Provider/Observer:  Ilean Skill, Psy.D.       Clinical Neuropsychologist       Billing Code/Service: 10626  Chief Complaint:    Channing Yeager is a 58 year old male with history of alcohol use and tobacco abuse.  Presented 06/21/2018 with progressive lower extremity weakness and gait instability.  MRI showed significant cervical stenosis at c5/6 with spinal cord compression.  Underwent discectomy C5/6 for decompression and arthrodesis anterior interbody technique 06/26/2018.  Patient admitted for a comprehensive rehab program.    Reason for Service:  RSW:NIOEVOJ Leyda is a 58 year old right-handed male history of alcohol use and tobacco use. Per chart review reports patient was living in a storage building that was converted into an apartment on his parents property and has electricity but no bathroom so he goes to the main house for his toileting needs and kitchen needs. He was using a straight point cane prior to admission.His wife also lives in the main house and can provide assistance on discharge.Presented 06/21/2018 with progressive lower extremity weakness and gait instability. COVID negative. CT of the head showed no acute intracranial abnormalities. MRI completed demonstrating significant cervical stenosis at C5-6 with spinal cord compression. Underwent discectomy C5-6 for decompression and arthrodesis anterior interbody technique 06/26/2018 per Dr. Kathyrn Sheriff. Hospital course pain management. No brace needed. Patient has been monitored closely for alcohol withdrawal. Tolerating a regular diet.  Maintained on low-dose beta-blocker for some tachycardia. No chest pain or increasing shortness of breath. Therapy evaluations completed and patient was admitted for a comprehensive rehab program.  Current Status:  Patient showed significant attention and concentration issues during visit today.  Remote memory generally intact but poor recall of recent details, even events earlier in the morning.  Patient had difficulty focusing attention and sustaining attention.  Patient was able to shift attention adequately but was easily distracted.  Patient was disoriented to what hospital he was in but was oriented to person and situation.    Behavioral Observation: Ariyon Mittleman  presents as a 58 y.o.-year-old Right Caucasian Male who appeared his stated age. his dress was Appropriate and he was Well Groomed and his manners were Appropriate to the situation.  his participation was indicative of Appropriate, Inattentive and Redirectable behaviors.  There were any physical disabilities noted.  he displayed an appropriate level of cooperation and motivation.     Interactions:    Active Appropriate, Inattentive and Redirectable  Attention:   abnormal and attention span appeared shorter than expected for age  Memory:   abnormal; remote memory intact, recent memory impaired  Visuo-spatial:  not examined  Speech (Volume):  normal  Speech:   normal; normal  Thought Process:  Coherent and Tangential  Though Content:  WNL; not suicidal and not homicidal  Orientation:   person and situation  Judgment:   Poor  Planning:   Poor  Affect:    Appropriate  Mood:    Euthymic  Insight:   Shallow  Intelligence:   low  Substance Use:  There is a documented history of alcohol abuse confirmed  by the patient.    Medical History:   Past Medical History:  Diagnosis Date  . Alcohol use     Psychiatric History:  Patient has significant history of alcohol abuse  Family Med/Psych History:  Family  History  Problem Relation Age of Onset  . Diabetes Paternal Aunt     Risk of Suicide/Violence: low Patient denies SI or HI  Impression/DX:  Teressa SenterGregory Wendt is a 58 year old male with history of alcohol use and tobacco abuse.  Presented 06/21/2018 with progressive lower extremity weakness and gait instability.  MRI showed significant cervical stenosis at c5/6 with spinal cord compression.  Underwent discectomy C5/6 for decompression and arthrodesis anterior interbody technique 06/26/2018.  Patient admitted for a comprehensive rehab program.   Patient showed significant attention and concentration issues during visit today.  Remote memory generally intact but poor recall of recent details, even events earlier in the morning.  Patient had difficulty focusing attention and sustaining attention.  Patient was able to shift attention adequately but was easily distracted.  Patient was disoriented to what hospital he was in but was oriented to person and situation.   Patient displaying considerable cognitive deficits, primarily related to attention and memory deficits, likely due to alcohol related encephalopathy although acute UTI or metabolic issues may also be playing a role.    Diagnosis:    Encephalopathy        Electronically Signed   _______________________ Arley PhenixJohn Quavis Klutz, Psy.D.

## 2018-07-08 ENCOUNTER — Inpatient Hospital Stay (HOSPITAL_COMMUNITY): Payer: Self-pay | Admitting: Speech Pathology

## 2018-07-08 ENCOUNTER — Inpatient Hospital Stay (HOSPITAL_COMMUNITY): Payer: Self-pay | Admitting: Physical Therapy

## 2018-07-08 ENCOUNTER — Inpatient Hospital Stay (HOSPITAL_COMMUNITY): Payer: Self-pay

## 2018-07-08 ENCOUNTER — Inpatient Hospital Stay (HOSPITAL_COMMUNITY): Payer: Self-pay | Admitting: Occupational Therapy

## 2018-07-08 LAB — VITAMIN B1: Vitamin B1 (Thiamine): 165.1 nmol/L (ref 66.5–200.0)

## 2018-07-08 MED ORDER — CAMPHOR-MENTHOL 0.5-0.5 % EX LOTN
TOPICAL_LOTION | Freq: Two times a day (BID) | CUTANEOUS | Status: DC
  Administered 2018-07-08 – 2018-07-15 (×15): via TOPICAL
  Filled 2018-07-08: qty 222

## 2018-07-08 MED ORDER — QUETIAPINE FUMARATE 50 MG PO TABS
100.0000 mg | ORAL_TABLET | Freq: Every day | ORAL | Status: DC
  Administered 2018-07-08 – 2018-07-15 (×8): 100 mg via ORAL
  Filled 2018-07-08 (×8): qty 2

## 2018-07-08 NOTE — Progress Notes (Signed)
Physical Therapy Session Note  Patient Details  Name: Curtis Bowen MRN: [id number] Date of Birth: 1960/03/21  Today's Date: 07/08/2018 PT Individual Time: 0998-3382 PT Individual Time Calculation (min): 55 min   Short Term Goals: Week 1:  PT Short Term Goal 1 (Week 1): Pt will complete least restrictive transfer with mod A consistently PT Short Term Goal 2 (Week 1): Pt will initiate w/c mobility PT Short Term Goal 3 (Week 1): Pt will recall 2/3 cervical precautions  Skilled Therapeutic Interventions/Progress Updates:    Pt with RN requesting to use bathroom. Attempted sit <> stand with Stedy but with max +2 pt unable to clear bottom to full stand to get flaps down. Due to pt reported urgency and fatigue, determined use of bedpan would be safer option. Min assist slideboard transfer back to bed with cues for safety (impulsive), hand placement, technique, and anterior weightshift. Mod assist to return to supine for BLE management. Rolling with mod assist with use of bed rails and assist for placement of BLE during rolling several times for brief management and on/off bed pan. Assisted with positioning and pt unsuccessful. Pt perseverative on his "balls being beat up from riding a horse" and max cues to attend to toileting task. Required constant redirection throughout session and reoriented to place, situation and time. Ultimately transferred off of bedpan and engaged in supine BLE strengthening exercises for functional mobility and maybe to attempt to engage bowels. Performed active assisted heel slides and clamshells x 10 reps each side with cues for eccentric control. Pt reporting urge to attempt to "give a sample of stool again" and repositioned back on bedpan and notified NT.  Therapy Documentation Precautions:  Precautions Precautions: Cervical, Fall(no brace required) Precaution Booklet Issued: No Restrictions Weight Bearing Restrictions: No Vital Signs: Therapy Vitals Pulse Rate:  75 BP: 132/71 Patient Position (if appropriate): Lying Pain:  c/o pain in his testicles and back. RN aware. Repositioned as able.      Therapy/Group: Individual Therapy  [name], PT, DPT, CBIS  07/08/2018, 2:13 PM

## 2018-07-08 NOTE — Plan of Care (Signed)
  Problem: RH Cognition - SLP Goal: RH LTG Patient will demonstrate orientation with cues Description:  LTG:  Patient will demonstrate orientation to person/place/time/situation with cues (SLP)   Flowsheets (Taken 07/08/2018 1441) LTG: Patient will demonstrate orientation using cueing (SLP): Maximal Assistance - Patient 25 - 49% Note: Downgraded 6/30 d/t severity of encephalopathy    Problem: RH Problem Solving Goal: LTG Patient will demonstrate problem solving for (SLP) Description: LTG:  Patient will demonstrate problem solving for basic/complex daily situations with cues  (SLP) Flowsheets (Taken 07/08/2018 1441) LTG Patient will demonstrate problem solving for: Maximal Assistance - Patient 25 - 49% Note: Downgraded 6/30 - d/t severity of encephalopathy    Problem: RH Memory Goal: LTG Patient will use memory compensatory aids to (SLP) Description: LTG:  Patient will use memory compensatory aids to recall biographical/new, daily complex information with cues (SLP) Outcome: Not Met (add Reason) Note: No longer appropriate d/t severity of encephalopathy   Problem: RH Attention Goal: LTG Patient will demonstrate this level of attention during functional activites (SLP) Description: LTG:  Patient will will demonstrate this level of attention during functional activites (SLP) Flowsheets (Taken 07/08/2018 1441) Patient will demonstrate during cognitive/linguistic activities the attention type of: Sustained Patient will demonstrate this level of attention during cognitive/linguistic activities in: Controlled LTG: Patient will demonstrate this level of attention during cognitive/linguistic activities with assistance of (SLP): Maximal Assistance - Patient 25 - 49% Number of minutes patient will demonstrate attention during cognitive/linguistic activities: 5 Note: Downgraded d/t severity of encephalopathy    Problem: RH Awareness Goal: LTG: Patient will demonstrate awareness during functional  activites type of (SLP) Description: LTG: Patient will demonstrate awareness during functional activites type of (SLP) Flowsheets (Taken 07/08/2018 1441) LTG: Patient will demonstrate awareness during cognitive/linguistic activities with assistance of (SLP): Maximal Assistance - Patient 25 - 49% Note: Downgraded d/t severity of encephalopathy

## 2018-07-08 NOTE — Progress Notes (Signed)
Pt appears to be agitated, irritable and has not been to sleep. Pt continues to talk one self, talk to the people he thinks in the room, and messing with everything he can reach. Pt never ate dinner, even after attempting to help feed him. Pt is easily distracted by his surroundings. Pt requested for something to help him sleep.

## 2018-07-08 NOTE — Progress Notes (Signed)
Physical Therapy Session Note  Patient Details  Name: Curtis Bowen MRN: [id number] Date of Birth: November 19, 1960  Today's Date: 07/08/2018 PT Individual Time: 1130-1200 PT Individual Time Calculation (min): 30 min   Short Term Goals: Week 1:  PT Short Term Goal 1 (Week 1): Pt will complete least restrictive transfer with mod A consistently PT Short Term Goal 2 (Week 1): Pt will initiate w/c mobility PT Short Term Goal 3 (Week 1): Pt will recall 2/3 cervical precautions  Skilled Therapeutic Interventions/Progress Updates:    Pt received seated in w/c in room, agreeable to work with therapist but remains disoriented to place and situation throughout session. No complaints of pain but reports numbness in B hands and feet. Assisted pt with putting lotion on his hands and arms, max A for management of lotion bottle but is able to spread across his skin independently. Pt is max to total A to don w/c gloves. Manual w/c propulsion x 100 ft with use of BUE and Supervision. Pt left seated in w/c in room with needs in reach, quick release belt and chair alarm in place, telesitter present at end of session.  Therapy Documentation Precautions:  Precautions Precautions: Cervical, Fall(no brace required) Precaution Booklet Issued: No Restrictions Weight Bearing Restrictions: No    Therapy/Group: Individual Therapy   Excell Seltzer, PT, DPT  07/08/2018, 4:18 PM

## 2018-07-08 NOTE — Progress Notes (Signed)
Speech Language Pathology Weekly Progress and Session Note  Patient Details  Name: Curtis Bowen MRN: [id number] Date of Birth: 22-Apr-1960  Beginning of progress report period: July 01, 2018 End of progress report period: July 08, 2018  Today's Date: 07/08/2018 SLP Individual Time: 1210-1225 SLP Individual Time Calculation (min): 15 min  Short Term Goals: Week 1: SLP Short Term Goal 1 (Week 1): Pt will utilize external aids to recall orientation information in 6 out of 10 opportunties with Min A cues. SLP Short Term Goal 1 - Progress (Week 1): Not met SLP Short Term Goal 2 (Week 1): Pt will sustain attention to basic familiar task for 5 minutes with Mod A cues. SLP Short Term Goal 2 - Progress (Week 1): Not met SLP Short Term Goal 3 (Week 1): Pt will demonstrate intellectual awareness by answering yes/no questions in 7 out of 10 opportunities with Mod A cues. SLP Short Term Goal 3 - Progress (Week 1): Not met    New Short Term Goals: Week 2: SLP Short Term Goal 1 (Week 2): With Max A cues, pt will sustain attention to task for 3 minutes. SLP Short Term Goal 2 (Week 2): With Max A cues, pt will solve basic problem solving tasks.  Weekly Progress Updates: Pt has made very little progress this reporting period d/t significant encephalopathy. Pt requires Max A for all cognitive tasks and doesn't demonstrate the ability for learning new information. Unfortunately skilled ST isn't likely to impact and encephalopathy deficits maybe irrepressible.  As such, ST services have been reduced to 1 to 3 times per week and LTGs set at Max A.      Intensity: Minumum of 1-2 x/day, 30 to 90 minutes Frequency: 1 to 3 out of 7 days Duration/Length of Stay: 3 weeks Treatment/Interventions: Functional tasks;Patient/family education   Daily Session  Skilled Therapeutic Interventions:  Skilled treatment session focused on cognition goals. SLP facilitiated session by providing Total A for orientation  information with no pt ability to retain or carry over information. Education also provided to pt on areas that therapies are targeting. Each time, pt appears that information is new to him.    General    Pain    Therapy/Group: Individual Therapy  Latona Krichbaum 07/08/2018, 2:46 PM
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
## 2018-07-09 ENCOUNTER — Inpatient Hospital Stay (HOSPITAL_COMMUNITY): Payer: Self-pay | Admitting: Physical Therapy

## 2018-07-09 ENCOUNTER — Inpatient Hospital Stay (HOSPITAL_COMMUNITY): Payer: Medicare Other

## 2018-07-09 ENCOUNTER — Inpatient Hospital Stay (HOSPITAL_COMMUNITY): Payer: Self-pay

## 2018-07-09 ENCOUNTER — Inpatient Hospital Stay (HOSPITAL_COMMUNITY): Payer: Self-pay | Admitting: Occupational Therapy

## 2018-07-09 DIAGNOSIS — R509 Fever, unspecified: Secondary | ICD-10-CM

## 2018-07-09 DIAGNOSIS — D72823 Leukemoid reaction: Secondary | ICD-10-CM

## 2018-07-09 LAB — BASIC METABOLIC PANEL
Anion gap: 12 (ref 5–15)
BUN: 9 mg/dL (ref 6–20)
CO2: 21 mmol/L — ABNORMAL LOW (ref 22–32)
Calcium: 8.2 mg/dL — ABNORMAL LOW (ref 8.9–10.3)
Chloride: 103 mmol/L (ref 98–111)
Creatinine, Ser: 0.75 mg/dL (ref 0.61–1.24)
GFR calc Af Amer: >60 mL/min (ref >60)
GFR calc non Af Amer: >60 mL/min (ref >60)
Glucose, Bld: 97 mg/dL (ref 70–99)
Potassium: 3.5 mmol/L (ref 3.5–5.1)
Sodium: 136 mmol/L (ref 135–145)

## 2018-07-09 LAB — CBC
HCT: 30.6 % — ABNORMAL LOW (ref 39.0–52.0)
Hemoglobin: 10.8 g/dL — ABNORMAL LOW (ref 13.0–17.0)
MCH: 34.4 pg — ABNORMAL HIGH (ref 26.0–34.0)
MCHC: 35.3 g/dL (ref 30.0–36.0)
MCV: 97.5 fL (ref 80.0–100.0)
Platelets: 708 10*3/uL — ABNORMAL HIGH (ref 150–400)
RBC: 3.14 MIL/uL — ABNORMAL LOW (ref 4.22–5.81)
RDW: 15 % (ref 11.5–15.5)
WBC: 19.2 10*3/uL — ABNORMAL HIGH (ref 4.0–10.5)
nRBC: 0 % (ref 0.0–0.2)

## 2018-07-09 LAB — URINALYSIS, COMPLETE (UACMP) WITH MICROSCOPIC
Bilirubin Urine: NEGATIVE
Glucose, UA: NEGATIVE mg/dL
Hgb urine dipstick: NEGATIVE
Ketones, ur: NEGATIVE mg/dL
Nitrite: POSITIVE — AB
Protein, ur: 100 mg/dL — AB
Specific Gravity, Urine: 1.018 (ref 1.005–1.030)
WBC, UA: >50 WBC/hpf — ABNORMAL HIGH (ref 0–5)
pH: 5 (ref 5.0–8.0)

## 2018-07-09 MED ORDER — SODIUM CHLORIDE 0.9 % IV SOLN
1.0000 g | INTRAVENOUS | Status: DC
  Administered 2018-07-09 – 2018-07-10 (×2): 1 g via INTRAVENOUS
  Filled 2018-07-09: qty 10
  Filled 2018-07-09 (×2): qty 1

## 2018-07-09 MED ORDER — CHLORDIAZEPOXIDE HCL 5 MG PO CAPS
5.0000 mg | ORAL_CAPSULE | Freq: Three times a day (TID) | ORAL | Status: DC
  Administered 2018-07-09 – 2018-07-11 (×6): 5 mg via ORAL
  Filled 2018-07-09 (×6): qty 1

## 2018-07-09 NOTE — Patient Care Conference (Signed)
Inpatient RehabilitationTeam Conference and Plan of Care Update Date: 07/08/2018   Time: 2:10 PM    Patient Name: Curtis SenterGregory Bowen      Medical Record Number: [id number]  Date of Birth: [birth year] Sex: Male         Room/Bed: 4W08C/4W08C-01 Payor Info: Payor: MEDICARE / Plan: MEDICARE PART A / Product Type: *No Product type* /    Admitting Diagnosis: 1. SCI Team  Other NTSC dysf, Cervical myelopathy; 20-21 days  Admit Date/Time:  07/01/2018  5:09 PM Admission Comments: No comment available   Primary Diagnosis:  <principal problem not specified> Principal Problem: <principal problem not specified>  Patient Active Problem List   Diagnosis Date Noted  . Encephalopathy   . Cervical myelopathy (HCC) 07/01/2018  . Cervical spinal stenosis 06/21/2018  . Alcohol use   . Hyponatremia   . Tobacco use   . Wheezing     Expected Discharge Date: Expected Discharge Date: (TBD)  Team Members Present: Physician leading conference: Dr. Faith RogueZachary Swartz Social Worker Present: Staci AcostaJenny Rahiem Schellinger, LCSW Nurse Present: Regino SchultzeHilary Lilja, RN PT Present: Peter Congoaylor Turkalo, PT OT Present: Amy Rounds, OT SLP Present: Reuel DerbyHappi Overton, SLP PPS Coordinator present : Fae PippinMelissa Bowie, SLP     Current Status/Progress Goal Weekly Team Focus  Medical   cervical myeloapthy, significant etoh encephalopathy, uti, incontinent  improve bladder and bowel continence  rx uti, cognitive and etoh deficits   Bowel/Bladder   Pt is continent of B/B with high amount of incontinent episodes. LBM: 07/03/18 w/ a episode of a smear. Pt denies being constipated and abd not tender or distended.  Remain continent with normal bowel pattern.  Offer Q2H toileting, educate pt on proper use of the urinal.   Swallow/Nutrition/ Hydration             ADL's   Min-mod A slide board transfers, max A LB dressing, Supervision- min A UB bathing/dressing, total A toileting  Supervision overall, my need to downgrade pending cognitive remediation  Attention to  task, ADL re-training, functional transfers, cognitive remediation   Mobility   min A bed mobility, min to mod A SB transfer, S to min A w/c mobility, max A to total A to stand  close Supervision overall, min A car transfer  BLE NMR, transfers, attending to therapy tasks   Communication             Safety/Cognition/ Behavioral Observations  Total to Max A cues  Max A cues  sustained attention, basic problem solving, awareness   Pain   Pt is currently pain free w. occasional c/o headache/ bilateral lower extremity pain.  Pt pain level <3  Assess pain QShift/PRN   Skin   Pts buttocks has small red area noted, prophylactic dressing applied.  Prevent further breakdown of skin.  Assess ksin QShift/PRN, address needs as needed.    Rehab Goals Patient on target to meet rehab goals: No Rehab Goals Revised: goals may need to be downgraded due to pt not albe to focus or carry over information. *See Care Plan and progress notes for long and short-term goals.     Barriers to Discharge  Current Status/Progress Possible Resolutions Date Resolved   Physician    Neurogenic Bowel & Bladder;Medical stability  profound cognitive and behavioral deficits     continued supervision/supportive care      Nursing                  PT  Decreased caregiver support;Home environment access/layout;Behavior  OT                  SLP (significant history of alcoholism)              SW                Discharge Planning/Teaching Needs:  Pt to return home with his wife as long as she can manage his needs at home.  Wife to come in for family education later this week to see if she can meet his needs at home.   Team Discussion:  Pt with ongoing cognitive deficits due to encephalopathy from alcohol abuse.  He also has a UTI, so team was hopeful he would clear some with treatment, but that has not happened yet.  Pt can be incontinent and continent.  He has redness to both calves with skin tear on  one.  Pt needs max cueing for attention, min/mod A fpr s;ode board txs, needs more help for LB tasks.  Pt has no carryover, so it's like starting over with him every 2 minutes.  He is not able to retain any new learning.  It's hard for pt to focus.  He is min to mod A overall and is S/CGA for w/c.  He needs max A to stand from elevated surfaces.    Revisions to Treatment Plan:  ST frequency changed to 1-3 x/week.    Continued Need for Acute Rehabilitation Level of Care: The patient requires daily medical management by a physician with specialized training in physical medicine and rehabilitation for the following conditions: Daily direction of a multidisciplinary physical rehabilitation program to ensure safe treatment while eliciting the highest outcome that is of practical value to the patient.: Yes Daily medical management of patient stability for increased activity during participation in an intensive rehabilitation regime.: Yes Daily analysis of laboratory values and/or radiology reports with any subsequent need for medication adjustment of medical intervention for : Neurological problems;Post surgical problems;Wound care problems;Urological problems   I attest that I was present, lead the team conference, and concur with the assessment and plan of the team.Team conference was held via web/ teleconference due to [place] - 19.    Adetokunbo Mccadden, Silvestre Mesi 07/09/2018, 12:36 AM

## 2018-07-09 NOTE — Progress Notes (Signed)
Physical Therapy Session Note  Patient Details  Name: Curtis Bowen MRN: [id number] Date of Birth: 06-24-1960  Today's Date: 07/09/2018 PT Individual Time: 0800-0907 PT Individual Time Calculation (min): 67 min   Short Term Goals: Week 1:  PT Short Term Goal 1 (Week 1): Pt will complete least restrictive transfer with mod A consistently PT Short Term Goal 2 (Week 1): Pt will initiate w/c mobility PT Short Term Goal 3 (Week 1): Pt will recall 2/3 cervical precautions  Skilled Therapeutic Interventions/Progress Updates:   Pt resting in bed.  Pt confused, but cooperative.  He was unable to recall or demonstrate functionally any understanding of cervical precautions.  He did follow cues for log rolling.  PT reviewed precautions.  Rolling L><R for donning pants by PT.  Then pt stated he needed to urinate.  Max assist for clothing mgt and to place urinal with pt in supine.  Pt urinated approximately 40 ml urine.  Max assist to sit EOB to continue to urinate per pt request.   +2 to attempt to "finish" urinating, in sitting EOB, without results.  Total assist to move into supine for brief and clothing re-donning.    Supine> sit EOB with max assist.  Pt balanced in sitting with close supervision > min assist, using 1 rail.  Slide board transfer to R to w/c, mod/max assist.  Sitting in w/c , pt drank coffee using bil hands without spillage.  With PT cutting up breakfast items, pt slowly used fork in R hand to feed himself 1 piece of [place] toast in 10 minutes.  Pt distracted by numbness bil hands, and speaking about his father, who was in the [employer]. PT donned seat belt alarm.   Pt then stated that he needed to urinate again.  Pt became agitated, attempting to remove seat belt alarm. Telesitter announced that he should not remove the belt, and pt listened and stopped.   Pt in w/c awaiting NT to assist him with urinal, at end of session.  Needs at hand.      Therapy Documentation Precautions:   Precautions Precautions: Cervical, Fall(no brace required) Precaution Booklet Issued: No Restrictions Weight Bearing Restrictions: No  Pain: Pain Assessment Unable to rate, but c/o bil hand numbness, hamstring spasms, numb feet     Therapy/Group: Individual Therapy  Kinsleigh Ludolph 07/09/2018, 10:23 AM

## 2018-07-09 NOTE — Progress Notes (Signed)
Occupational Therapy Weekly Progress Note  Patient Details  Name: Curtis Bowen MRN: [id number] Date of Birth: 07-13-1960  Beginning of progress report period: July 02, 2018 End of progress report period: July 09, 2018  Today's Date: 07/09/2018 OT Individual Time: 1300-1400 OT Individual Time Calculation (min): 60 min    Patient has met 1 of 3 short term goals.  Pt making slow progress towards OT goals. He is most limited by severely impaired cognition, medical team suspecting due to encephalopathy due to alcohol abuse. He is oriented to person only with no carryover of orientation or education within a session.  He is completing LB ADLs from bed level with mod-max A and total A VCs for attention to task. Sliding board transfers are completed with min-mod A, VCs/ education with each transfer as pt with no carryover of education btwn trials.  Plan to have pt's wife come and assess pt to determine if she will be able to provide the physical and cognitive assist pt will require at d/c.   Patient continues to demonstrate the following deficits: muscle weakness and muscle paralysis, decreased cardiorespiratoy endurance, decreased attention, decreased awareness, decreased problem solving, decreased safety awareness, decreased memory and delayed processing and decreased sitting balance, decreased postural control and decreased balance strategies and therefore will continue to benefit from skilled OT intervention to enhance overall performance with BADL and Reduce care partner burden.  Patient progressing toward long term goals..  Goals will be left at supervision at this time. May need to downgrade pending pt cognitive remediation and d/c disposition.   OT Short Term Goals Week 1:  OT Short Term Goal 1 (Week 1): Pt will consistently complete basic transfers with mod A using LRAD in order to reduce caregiver burden OT Short Term Goal 1 - Progress (Week 1): Met OT Short Term Goal 2 (Week 1): Pt will  consistently be oriented to situation with min cuing throughout 60 minute session OT Short Term Goal 2 - Progress (Week 1): Not met OT Short Term Goal 3 (Week 1): Pt will don pants with min A using LRAD. OT Short Term Goal 3 - Progress (Week 1): Not met Week 2:  OT Short Term Goal 1 (Week 2): STG=LTG due to LOS  Skilled Therapeutic Interventions/Progress Updates:    Pt seen for OT session focusing on self-feeding, attention to task, and w/c propulsion. Pt sitting up in w/c upon arrival, pleasantly confused and agreeable to tx session. Pt reports he is in "bristol". Attempts to re-orient throughout session. He stated he did not want to leave room as he has "been at it" all day, however, could not begin to describe what he has been up to today.  Encouraged pt to go out of room in order to eat lunch. He self propelled w/c throughout unit with supervision. He ate lunch at high/low table. Provided with red built up gripper for utensil. VCs and trials for various grasp techniques on utensil as pt with difficulty maintaining functional grasp while self-feeding. Pt easily distracted by internal and external factors with difficulty carrying over techniques suggested by therapist. He ate ~25% of meal during 40 minutes of session spent working on self-feeding. He returned to room. Completed sliding board transfer back to bed. Initially max A with pt with difficulties following cuing for head/hip relationship and anterior lean needed for therapist to then only provide mod A for transfer. Mod A to return to supine. Pt with increased complaints of nerve pain this session, described as "burning" in  B hands and hypersensitive to touch in B LEs. RN made aware.  Pt set-up sitting upright in bed at end of session to cont to work on eating lunch. Distractions minimized in room and pt left with all needs in reach.   Therapy Documentation Precautions:  Precautions Precautions: Cervical, Fall(no brace required) Precaution  Booklet Issued: No Restrictions Weight Bearing Restrictions: No   Therapy/Group: Individual Therapy  Curtis Bowen L 07/09/2018, 7:06 AM
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
## 2018-07-09 NOTE — Progress Notes (Signed)
Occupational Therapy Session Note  Patient Details  Name: Curtis Bowen MRN: [id number] Date of Birth: 01-Jan-1961  Today's Date: 07/09/2018 OT Individual Time: 1530-1600 OT Individual Time Calculation (min): 30 min    Short Term Goals: Week 2:  OT Short Term Goal 1 (Week 2): STG=LTG due to LOS  Skilled Therapeutic Interventions/Progress Updates:    Pt received supine in bed, phone ringing with wife calling. Pt spoke with his wife for several minutes re her coming in for family ed session tomorrow. Pt oriented to person only this session despite cueing. Pt completed bed mobility to EOB with mod A for BLE management and assistance for scooting forward. Once EOB and preparing for transfer it was observed that pt had bowel incontinence in brief. Pt was transferred back to supine. He rolled R and L with mod A for peri hygiene. Pt requested to be put on bed pan and declined OOB transfer. Pt had no further void other than urine. RN and NT consulted 2/2 urine appearance. Brief was donned total A and pt left supine with all needs met. Pt perseverative and disoriented throughout session attempting to convince this OT to move to [place].   Therapy Documentation Precautions:  Precautions Precautions: Cervical, Fall(no brace required) Precaution Booklet Issued: No Restrictions Weight Bearing Restrictions: No   Therapy/Group: Individual Therapy  Curtis Sites 07/09/2018, 3:27 PM

## 2018-07-09 NOTE — Progress Notes (Signed)
Physical Therapy Session Note  Patient Details  Name: Curtis Bowen MRN: [id number] Date of Birth: 1960-01-21  Today's Date: 07/09/2018 PT Individual Time: 1100-1200 PT Individual Time Calculation (min): 60 min   Short Term Goals: Week 1:  PT Short Term Goal 1 (Week 1): Pt will complete least restrictive transfer with mod A consistently PT Short Term Goal 2 (Week 1): Pt will initiate w/c mobility PT Short Term Goal 3 (Week 1): Pt will recall 2/3 cervical precautions  Skilled Therapeutic Interventions/Progress Updates:    Pt received seated in w/c in room attempting to reach thermostat to turn up the temperature but limited by safety alarm belt. Assisted pt with removing safety belt and changing the temperature in his room. Pt oriented to place and remembers talking to his wife Arbie Cookey this AM who reminded him he was in the hospital for neck surgery. Pt is easier to direct to participate in therapy tasks this session. Added theraband to w/c rims for improved grip with propulsion. Pt is max A to don w/c gloves and perseverates on adjusting gloves throughout session. Manual w/c propulsion 2 x 100 ft with use of BUE and Supervision. Pt reports increase in numbness/tingling in fingers with propulsion. Pt declines to participate in any functional transfers during therapy session. Pt also reports numbness in buttocks from sitting in w/c. Attempt to have pt perform w/c pushups for pressure relief, unable to clear buttocks due to UE weakness. Pt is able to perform anterior leans reaching down to his toes with BUE 3 x 60 sec each for pressure relief and low back stretch. Pt left seated in w/c in room with needs in reach, quick release belt and chair alarm in place, telesitter present at end of session.  Therapy Documentation Precautions:  Precautions Precautions: Cervical, Fall(no brace required) Precaution Booklet Issued: No Restrictions Weight Bearing Restrictions: No    Therapy/Group: Individual  Therapy   Excell Seltzer, PT, DPT  07/09/2018, 12:31 PM

## 2018-07-10 ENCOUNTER — Inpatient Hospital Stay (HOSPITAL_COMMUNITY): Payer: Self-pay | Admitting: Occupational Therapy

## 2018-07-10 ENCOUNTER — Inpatient Hospital Stay (HOSPITAL_COMMUNITY): Payer: Self-pay | Admitting: Physical Therapy

## 2018-07-10 ENCOUNTER — Inpatient Hospital Stay (HOSPITAL_COMMUNITY): Payer: Self-pay

## 2018-07-10 LAB — CBC
HCT: 31.9 % — ABNORMAL LOW (ref 39.0–52.0)
Hemoglobin: 11.2 g/dL — ABNORMAL LOW (ref 13.0–17.0)
MCH: 34.4 pg — ABNORMAL HIGH (ref 26.0–34.0)
MCHC: 35.1 g/dL (ref 30.0–36.0)
MCV: 97.9 fL (ref 80.0–100.0)
Platelets: 734 10*3/uL — ABNORMAL HIGH (ref 150–400)
RBC: 3.26 MIL/uL — ABNORMAL LOW (ref 4.22–5.81)
RDW: 15.1 % (ref 11.5–15.5)
WBC: 18.7 10*3/uL — ABNORMAL HIGH (ref 4.0–10.5)
nRBC: 0 % (ref 0.0–0.2)

## 2018-07-10 NOTE — Progress Notes (Signed)
Physical Therapy Weekly Progress Note  Patient Details  Name: Arcenio Mullaly MRN: [id number] Date of Birth: 1960-08-12  Beginning of progress report period: July 02, 2018 End of progress report period: July 10, 2018  Today's Date: 07/10/2018 PT Individual Time: 1130-1225 PT Individual Time Calculation (min): 55 min   Patient has met 2 of 3 short term goals.  Pt is able to complete bed mobility and slide board transfers with min A and is able to perform w/c mobility with Supervision cueing x 100 ft. Pt is unable to recall cervical precautions and is unable to retain any teaching or education that takes place 2/2 ongoing confusion and cognitive deficits. Pt is frequently perseverative during therapy sessions limiting ability to focus on functional tasks and retraining.  Patient continues to demonstrate the following deficits muscle weakness, abnormal tone and unbalanced muscle activation, decreased attention, decreased awareness, decreased problem solving, decreased safety awareness and decreased memory and decreased standing balance, decreased postural control, decreased balance strategies and difficulty maintaining precautions and therefore will continue to benefit from skilled PT intervention to increase functional independence with mobility.  Patient progressing toward long term goals..  Continue plan of care.  PT Short Term Goals Week 1:  PT Short Term Goal 1 (Week 1): Pt will complete least restrictive transfer with mod A consistently PT Short Term Goal 1 - Progress (Week 1): Met PT Short Term Goal 2 (Week 1): Pt will initiate w/c mobility PT Short Term Goal 2 - Progress (Week 1): Met PT Short Term Goal 3 (Week 1): Pt will recall 2/3 cervical precautions PT Short Term Goal 3 - Progress (Week 1): Not met Week 2:  PT Short Term Goal 1 (Week 2): =LTG due to ELOS  Skilled Therapeutic Interventions/Progress Updates:    Pt received seated in w/c in room with wife Arbie Cookey present for hands-on  family education session. Pt agreeable to attempt car transfer. Pt's wife reports she owns a [facility] and will measure seat height when she gets home and let therapy know the height. Demonstration of how to assist pt with slide board transfer in/out of car with min A for transfer and mod A for BLE management in/out of car. Then had pt's wife perform return demo with min cueing for safety with transfer. Pt's wife agreeable to return next week for 2nd family training session prior to d/c. Pt left seated in w/c in room with need in reach, quick release belt and chair alarm in place, telesitter and wife present at end of session.  Therapy Documentation Precautions:  Precautions Precautions: Cervical, Fall(no brace required) Precaution Booklet Issued: No Restrictions Weight Bearing Restrictions: No   Therapy/Group: Individual Therapy   Excell Seltzer, PT, DPT 07/10/2018, 1:17 PM

## 2018-07-10 NOTE — Progress Notes (Signed)
Physical Therapy Session Note  Patient Details  Name: Curtis Bowen MRN: [id number] Date of Birth: [birth year]  Today's Date: 07/10/2018 PT Individual Time: 5573-2202 PT Individual Time Calculation (min): 53 min   Short Term Goals: Week 1:  PT Short Term Goal 1 (Week 1): Pt will complete least restrictive transfer with mod A consistently PT Short Term Goal 2 (Week 1): Pt will initiate w/c mobility PT Short Term Goal 3 (Week 1): Pt will recall 2/3 cervical precautions  Skilled Therapeutic Interventions/Progress Updates:   Upon walking into room, pt was reading his schedule and looking "for something I need". Asked if he would like to eat breakfast but pt states he wanted to wait until later. Reoriented to place, situation and time as pt states he was going to go swimming today and proceeded to talk about doing the high dive. Once told he had had neck surgery, pt stated "well I guess then doing the high dive might be a bad idea." Pt initially declines to put on pants to get OOB stating he just going to rest today and take a break from "all of this." When informed that his wife was coming to see his therapy sessions, pt was agreeable to put on pants. Pt sat himself up in bed with bed controls and assisted with threading of pants by lifting legs (PT still needed to assist). Functional rolling with min assist and use of bed rails to pull up pants in the back. Wife, Arbie Cookey, arrived at this point and assisted with pulling up pants on other side. She performed hands on education for bed mobility (recommending log roll technique) from flat bed without rails, slide board transfer from bed -> w/c and repositioning in the w/c. Educated on correct board placement, body mechanics, correct guarding technique, checking positioning of feet throughout transfer, and importance of no bare bottom transfers with slideboard. Pt required min to mod assist for slideboard transfer overall with cues for hand placement,  sequencing and technique for head/hips relationship. Discussed bed height at home (reports slightly higher but ca touch the ground. recommended she measure the height so we can practice) and how height affects slideboard transfer when uneven. Discussed w/c accessibility in home and she reports it will fit everywhere except hte bathroom but she plans to set up [facility] in bedroom. For home entry, reports there are 3-4 steps and discussed recommendation for a ramp. She states they probably can work on that but likely will need to bump up in w/c for initial d/c home. Will need to practice this in future session. Her and granddaughter would be the ones to assist and similarly with car transfer. Educated on patients current fluctuation in cognition and often requiring reorientation to place, time, and situation and overall safety. She is hopeful this will improve in a familiar environment. He did ask more appropriate questions of her and their family during this session with her present and followed directions with min cues. Does still require frequent redirection to stay on task due to impaired attention. She is planning to stay for OT session this morning.   Therapy Documentation Precautions:  Precautions Precautions: Cervical, Fall(no brace required) Precaution Booklet Issued: No Restrictions Weight Bearing Restrictions: No Pain:  c/o numbness and tingling in BUE and BLE. Repositioned throughout session.   Therapy/Group: Individual Therapy  [name], PT, DPT, CBIS  07/10/2018, 9:36 AM

## 2018-07-10 NOTE — Progress Notes (Signed)
Occupational Therapy Session Note  Patient Details  Name: Curtis Bowen MRN: [id number] Date of Birth: 12-20-1960  Today's Date: 07/10/2018 OT Individual Time: 1000-1045 OT Individual Time Calculation (min): 45 min    Short Term Goals: Week 2:  OT Short Term Goal 1 (Week 2): STG=LTG due to LOS  Skilled Therapeutic Interventions/Progress Updates:    Pt seen for OT session focusing on caregiver education with pt's wife Arbie Cookey. Pt sitting up in w/c uipon arrival, agreeable to tx session. Reports of burning pain in B LEs, RN made aware and medication administered at end of session.  Extensive education/discussion with pt's wife regarding pt's CLOF, both physically and cognitively. She reports pt is slightly more confused currently than his baseline, level but attention deficits are baseline.  Practiced sliding board transfers w/c<> bed, therapist assisting on first trial and wife assisting on 2nd trial. Completed bed mobility with min A. Educated regarding bed level LB ADLs, wife declined to practice this session stating she has already him him at this level just before admission.  She assisted him with supine <>sitting EOB as well as transfers. Education and demonstration provided for proper body mechanics and how to assist. She assisted with min-mod A sliding board transfer back to w/c.  Introduced drop arm BSC and function. Time did not allow to practice. Following education session, pt's wife reports she is willing and able to provide needed assist at d/c and is agreeable to taking pt home. Will need to schedule one more day of family education prior to d/c, CSW made aware. Education provided throughout session regarding role of OT, pt's CLOF, DME, modified ADLs, and d/c planning.  She reports ramp is being built this weekend and does not want to get hospital bed for home use despite education provided regarding functional benefits of hospital bed.  Pt left set-up with breakfast tray at end of  session, chair belt alarm on and wife and RN present.   Therapy Documentation Precautions:  Precautions Precautions: Cervical, Fall(no brace required) Precaution Booklet Issued: No Restrictions Weight Bearing Restrictions: No   Therapy/Group: Individual Therapy  Eutha Cude L 07/10/2018, 6:57 AM
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
## 2018-07-10 NOTE — Progress Notes (Signed)
Occupational Therapy Session Note  Patient Details  Name: Radin Raptis MRN: [id number] Date of Birth: [birth year]  Today's Date: 07/10/2018 OT Individual Time: 2703-5009 OT Individual Time Calculation (min): 43 min    Short Term Goals: Week 1:  OT Short Term Goal 1 (Week 1): Pt will consistently complete basic transfers with mod A using LRAD in order to reduce caregiver burden OT Short Term Goal 1 - Progress (Week 1): Met OT Short Term Goal 2 (Week 1): Pt will consistently be oriented to situation with min cuing throughout 60 minute session OT Short Term Goal 2 - Progress (Week 1): Not met OT Short Term Goal 3 (Week 1): Pt will don pants with min A using LRAD. OT Short Term Goal 3 - Progress (Week 1): Not met Week 2:  OT Short Term Goal 1 (Week 2): STG=LTG due to LOS  Skilled Therapeutic Interventions/Progress Updates:    1:1. Pt received in w/c attempting to eat lunch. Pt requesting to return to EOB to eat meal d/t sore tailbone. Pt completes slide board transfer to EOB with MOD A and VC for head hips relationship. Pt immediately requesting to lie down and rest tail bone. Pt requires A for BLE sit>supine for rest break while OT sets up red foam handle on silverware. Pt sits EOB with supervision for sitting balance and pt consistently perseverates on wearing w/c gloves requiring nearly 10 min to don "so my fingertips dont freeze [gloves do not cover fingertips]." Pt requires constant redirection to self feeding task with no success. Exited session with pt seated in bed HOB upright and tray table set up. TV turned off for improved attention during self feeding as OT left room with exit alarm on  Therapy Documentation Precautions:  Precautions Precautions: Cervical, Fall(no brace required) Precaution Booklet Issued: No Restrictions Weight Bearing Restrictions: No General:   Vital Signs: Therapy Vitals Temp: 97.9 F (36.6 C) Temp Source: Oral Pulse Rate: 79 Resp: 20 BP:  111/75 Patient Position (if appropriate): Sitting Oxygen Therapy SpO2: 94 % O2 Device: Room Air Pain: Pain Assessment Pain Scale: 0-10 Pain Score: 4  ADL:   Vision   Perception    Praxis   Exercises:   Other Treatments:     Therapy/Group: Individual Therapy  Tonny Branch 07/10/2018, 3:26 PM

## 2018-07-10 NOTE — Progress Notes (Signed)
Social Work Patient ID: Curtis Bowen, male   DOB: 11-01-1960, 58 y.o.   MRN: [id number]   CSW spoke with pt's wife via telephone to update her on team conference discussion and team's request for her to come for family education so that she can see pt's required level of care will be manageable at home.  She plans to come the morning of 07-10-18.  CSW will update pt that day, as well, on discussion, but CSW did see pt and spoke with him briefly while he was in therapy.  Team and MD will set d/c date once family education is completed.

## 2018-07-11 ENCOUNTER — Inpatient Hospital Stay (HOSPITAL_COMMUNITY): Payer: Self-pay | Admitting: Speech Pathology

## 2018-07-11 ENCOUNTER — Inpatient Hospital Stay (HOSPITAL_COMMUNITY): Payer: Self-pay | Admitting: Occupational Therapy

## 2018-07-11 ENCOUNTER — Inpatient Hospital Stay (HOSPITAL_COMMUNITY): Payer: Self-pay | Admitting: Physical Therapy

## 2018-07-11 LAB — URINE CULTURE: Culture: >=100000 — AB

## 2018-07-11 LAB — CBC
HCT: 30.7 % — ABNORMAL LOW (ref 39.0–52.0)
Hemoglobin: 10.8 g/dL — ABNORMAL LOW (ref 13.0–17.0)
MCH: 34.4 pg — ABNORMAL HIGH (ref 26.0–34.0)
MCHC: 35.2 g/dL (ref 30.0–36.0)
MCV: 97.8 fL (ref 80.0–100.0)
Platelets: 766 10*3/uL — ABNORMAL HIGH (ref 150–400)
RBC: 3.14 MIL/uL — ABNORMAL LOW (ref 4.22–5.81)
RDW: 14.9 % (ref 11.5–15.5)
WBC: 14.5 10*3/uL — ABNORMAL HIGH (ref 4.0–10.5)
nRBC: 0 % (ref 0.0–0.2)

## 2018-07-11 MED ORDER — CHLORDIAZEPOXIDE HCL 5 MG PO CAPS
5.0000 mg | ORAL_CAPSULE | Freq: Two times a day (BID) | ORAL | Status: DC
  Administered 2018-07-11 – 2018-07-15 (×8): 5 mg via ORAL
  Filled 2018-07-11 (×8): qty 1

## 2018-07-11 MED ORDER — SODIUM CHLORIDE 0.9 % IV SOLN
2.0000 g | Freq: Two times a day (BID) | INTRAVENOUS | Status: DC
  Administered 2018-07-11 – 2018-07-16 (×10): 2 g via INTRAVENOUS
  Filled 2018-07-11 (×11): qty 2

## 2018-07-11 MED ORDER — SODIUM CHLORIDE 0.9 % IV SOLN: 1.0000 g | INTRAVENOUS | Status: DC

## 2018-07-11 NOTE — Plan of Care (Signed)
Downgraded transfer goal due to slow progress

## 2018-07-11 NOTE — Progress Notes (Signed)
Speech Language Pathology Daily Session Note  Patient Details  Name: Curtis Bowen MRN: [id number] Date of Birth: 07/25/1960  Today's Date: 07/11/2018 SLP Individual Time: 1015-1100 SLP Individual Time Calculation (min): 45 min  Short Term Goals: Week 2: SLP Short Term Goal 1 (Week 2): With Max A cues, pt will sustain attention to task for 3 minutes. SLP Short Term Goal 2 (Week 2): With Max A cues, pt will solve basic problem solving tasks.  Skilled Therapeutic Interventions:  Skilled treatment session focused on cognition goals. SLP facilitated session by providing Total A cues for sustained task of eating breakfast. Per report, at baseline pt took 3 hours to consume each meal. This fact further supports that pt had extensive cognitive inability at baseline. Given that he doesn't have any oropharyngeal deficits, would suspect pt's attention was impaired and thus he took longer for meals. During this session, pt perseveratively touches an item (like milk contain) wants to close the top and then touches it again to move in around tray multiple times. Pt not oriented to hospital setting and is able to retain for brief moments but doesn't retain. Pt left upright in wheelchair with all needs within reach. Continue per current plan of care.   Pain Pain Assessment Pain Scale: 0-10 Pain Score: 0-No pain  Therapy/Group: Individual Therapy  Delise Simenson 07/11/2018, 12:10 PM

## 2018-07-11 NOTE — Progress Notes (Signed)
Occupational Therapy Session Note  Patient Details  Name: Curtis Bowen MRN: [id number] Date of Birth: [birth year]  Today's Date: 07/11/2018 OT Individual Time: 0720-0835 OT Individual Time Calculation (min): 75 min    Short Term Goals: Week 2:  OT Short Term Goal 1 (Week 2): STG=LTG due to LOS  Skilled Therapeutic Interventions/Progress Updates:    Pt seen for OT ADL bathing/dressing session. Pt awake in supine upon arrival, trying to problem solve how to use tx remote/ bed controls. MAx cuing with no carryover for problem solving how to use.  Pt completed LB bathing/dressing routine from unsupported long sitting position. Set-up/supervision with VCs for LB bathing with cuing for attention to task. Pants donned with mod A overall with increased assist for clothing management due to pt's decreased strength and sensation in B hands. He was able to assist with bringing LEs into modified long sitting and figure four position in order to assist with threading on pants. HE returned to supine and rolled with min A using bed rails in order for buttock hygiene to be completed, new brief donned and pants pulled up total A.  He transitioned to sitting EOB, with mod A. Completed sliding board transfer EOB>w/c. With significantly increased time and VCs for technique and redirection to task, pt able to complete sliding board transfer for CGA.  He completed UB bathing/dressing from w/c level at sink with assist for set-up with increased time.  Pt set-up with breakfast tray at end of session, distractions minimized and all needs in reach with chair belt alarm on.   Therapy Documentation Precautions:  Precautions Precautions: Cervical, Fall(no brace required) Precaution Booklet Issued: No Restrictions Weight Bearing Restrictions: No   Therapy/Group: Individual Therapy  Curtis Bowen 07/11/2018, 6:44 AM

## 2018-07-11 NOTE — Plan of Care (Signed)
Goals downgraded due to pt progress. See POC for goal details. Jaxon Flatt, OTR/L

## 2018-07-11 NOTE — Progress Notes (Addendum)
PHARMACY NOTE:  ANTIMICROBIAL RENAL DOSAGE ADJUSTMENT  Current antimicrobial regimen includes a mismatch between antimicrobial dosage and estimated renal function.  As per policy approved by the Pharmacy & Therapeutics and Medical Executive Committees, the antimicrobial dosage will be adjusted accordingly.  Current antimicrobial dosage: Cefepime 1g IV q24h  Indication: UTI  Renal Function:  Estimated Creatinine Clearance: 87.8 mL/min (by C-G formula based on SCr of 0.75 mg/dL). []      On intermittent HD, scheduled: []      On CRRT    Antimicrobial dosage has been changed to: Cefepime 2g IV q12h   Thank you for allowing pharmacy to be a part of this patient's care.   Lindell Spar, PharmD, BCPS Clinical Pharmacist  07/11/2018 11:02 AM
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
## 2018-07-11 NOTE — Progress Notes (Signed)
Physical Therapy Session Note  Patient Details  Name: Curtis Bowen MRN: [id number] Date of Birth: 02-Jul-1960  Today's Date: 07/11/2018 PT Individual Time: 1300-1415 PT Individual Time Calculation (min): 75 min   Short Term Goals: Week 2:  PT Short Term Goal 1 (Week 2): =LTG due to ELOS  Skilled Therapeutic Interventions/Progress Updates:  Pt received seated in bed eating lunch, agreeable to PT session. Pt perseverates on numbness in fingers and LE throughout session. Pt oriented to place but believes he is in the hospital because he has Covid. Reoriented pt to being in the hospital to have neck surgery.Pt reports urge to urinate. Rolling L/R with min A and use of bedrails to doff pants and then setup A to place urinal. After several minutes pt reports he doesn't need to urinate and wants to try later. Rolling L/R with min A to dependently don pants. Supine to sit with mod A from flat bed. Sitting balance EOB with close Supervision while donning w/c gloves with increased time needed to don gloves. Pt agreeable to trial 16x16 w/c to determine best fit w/c for patient. Slide board transfer bed to w/c with min A with cues for weight shift and hand placement with transfer. Pt fits well into 16x16 w/c. Set up pt with lunch again at end of session. Pt left seated in w/c in room with needs in reach, quick release belt and chair alarm in place, telesitter present.  Therapy Documentation Precautions:  Precautions Precautions: Cervical, Fall(no brace required) Precaution Booklet Issued: No Restrictions Weight Bearing Restrictions: No    Therapy/Group: Individual Therapy   Excell Seltzer, PT, DPT  07/11/2018, 3:41 PM

## 2018-07-12 ENCOUNTER — Inpatient Hospital Stay (HOSPITAL_COMMUNITY): Payer: Self-pay

## 2018-07-12 DIAGNOSIS — I10 Essential (primary) hypertension: Secondary | ICD-10-CM

## 2018-07-12 DIAGNOSIS — D649 Anemia, unspecified: Secondary | ICD-10-CM

## 2018-07-12 DIAGNOSIS — N390 Urinary tract infection, site not specified: Secondary | ICD-10-CM

## 2018-07-12 DIAGNOSIS — F1010 Alcohol abuse, uncomplicated: Secondary | ICD-10-CM

## 2018-07-12 LAB — CBC
HCT: 33.2 % — ABNORMAL LOW (ref 39.0–52.0)
Hemoglobin: 11.7 g/dL — ABNORMAL LOW (ref 13.0–17.0)
MCH: 34.4 pg — ABNORMAL HIGH (ref 26.0–34.0)
MCHC: 35.2 g/dL (ref 30.0–36.0)
MCV: 97.6 fL (ref 80.0–100.0)
Platelets: 711 10*3/uL — ABNORMAL HIGH (ref 150–400)
RBC: 3.4 MIL/uL — ABNORMAL LOW (ref 4.22–5.81)
RDW: 15 % (ref 11.5–15.5)
WBC: 12.2 10*3/uL — ABNORMAL HIGH (ref 4.0–10.5)
nRBC: 0 % (ref 0.0–0.2)
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
## 2018-07-12 NOTE — Plan of Care (Signed)
  Problem: SCI BOWEL ELIMINATION Goal: RH STG MANAGE BOWEL WITH ASSISTANCE Description: STG Manage Bowel with mod.Assistance. Outcome: Not Progressing; small bm 4/2 ; poor appetite   Problem: SCI BLADDER ELIMINATION Goal: RH STG MANAGE BLADDER WITH ASSISTANCE Description: STG Manage Bladder With mod.Assistance Outcome: Not Progressing; incontinence at times   Problem: RH SAFETY Goal: RH STG ADHERE TO SAFETY PRECAUTIONS W/ASSISTANCE/DEVICE Description: STG Adhere to Safety Precautions With mod.Assistance/Device. Outcome: Not Progressing; telesitter   Problem: RH KNOWLEDGE DEFICIT SCI Goal: RH STG INCREASE KNOWLEDGE OF SELF CARE AFTER SCI Description: Pt. And family able to verbalized safety precautions before discharge Outcome: Not Progressing; confused

## 2018-07-13 ENCOUNTER — Inpatient Hospital Stay (HOSPITAL_COMMUNITY): Payer: Self-pay

## 2018-07-13 DIAGNOSIS — E639 Nutritional deficiency, unspecified: Secondary | ICD-10-CM

## 2018-07-13 DIAGNOSIS — R0989 Other specified symptoms and signs involving the circulatory and respiratory systems: Secondary | ICD-10-CM
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
## 2018-07-13 NOTE — Progress Notes (Signed)
Physical Therapy Session Note  Patient Details  Name: Curtis Bowen MRN: [id number] Date of Birth: [birth year]  Today's Date: 07/13/2018 PT Individual Time: 0930-1015 PT Individual Time Calculation (min): 45 min   Short Term Goals: Week 2:  PT Short Term Goal 1 (Week 2): =LTG due to ELOS  Skilled Therapeutic Interventions/Progress Updates:    Requires reorientation to date, place, and situation. Updated pt's calendar on wall for July including planned d/c date (made clear this was after family education occurs). Pt unable to recall date a few min later after clinician told him. Encouraged OOB for breakfast. Requires mod assist to come to EOB for BLE management - using bedrails able to assist with trunk. Performed mod assist slideboard transfer into w/c with cues for technique and anterior weightshift. Set up breakfast tray and requires max cues to stay on task and redirection. While working on breakfast focused on cognitive remediation addressing the above. Pt states he "vaguely remembers that he had surgery" after told that is why he is here. Perseverating on his hands being "numb and cold." Education and reorientation provided throughout. Left with all needs in reach.   Therapy Documentation Precautions:  Precautions Precautions: Cervical, Fall(no brace required) Precaution Booklet Issued: No Restrictions Weight Bearing Restrictions: No Pain: Reports back pain, coldness in hands, and his bottom being numb - RN administered pain medication during session.    Therapy/Group: Individual Therapy  [name], PT, DPT, CBIS  07/13/2018, 10:25 AM

## 2018-07-14 ENCOUNTER — Inpatient Hospital Stay (HOSPITAL_COMMUNITY): Payer: Self-pay | Admitting: Occupational Therapy

## 2018-07-14 ENCOUNTER — Inpatient Hospital Stay (HOSPITAL_COMMUNITY): Payer: Self-pay

## 2018-07-14 ENCOUNTER — Inpatient Hospital Stay (HOSPITAL_COMMUNITY): Payer: Self-pay | Admitting: Physical Therapy
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
## 2018-07-14 NOTE — Progress Notes (Signed)
Occupational Therapy Session Note  Patient Details  Name: Curtis Bowen MRN: [id number] Date of Birth: October 07, 1960  Today's Date: 07/14/2018 OT Individual Time: 3825-0539 OT Individual Time Calculation (min): 57 min    Short Term Goals: Week 2:  OT Short Term Goal 1 (Week 2): STG=LTG due to LOS  Skilled Therapeutic Interventions/Progress Updates:    Pt seen for OT ADL bathing/dressing session. Pt in supine awake, speaking with MD during AM Thekla Colborn. Pt agreeable to tx session. He was oriented to "rehab unit" and city, hwoever, did not know why he was receiving therapy. Complaints of "burning" in hands, RN made aware.  He completed bathing/dressing routine from long sitting position. Bed flattened in simulation of home environment, however, used bed rails to assist in obtaining long sitting position. He completed LB bathing with set-up/supervision with cuing throughout for attention to task and to move on to next part of bathing process. He applied lotion to B feet with set-up, RN made aware of excessive skin peeling and dryness in B feet. He threaded pants on from long sitting position with VCs for technique and min A for clothing management. Pt with no re-call of this technique taught in previous sessions. He returned to supine in order for brief to be changed, buttock hygiene completed and pants pulled up all with total A.  He donned shirt in long sitting with supervision, demonstrating good functional unsupported sitting balance.  He transitioned to sitting EOB from long sitting position with min A for management of B LEs and use of chuck pad to advance hips. He completed min A sliding board transfer to w/c with total A cuing for sequencing/technique and proper weight shift.  Pt left set-up in w/c at end of session, set-up with breakfast tray and all needs in reach, pt speaking with wife on phone at time of therapist's departure.   Pt with no knowledge of his wife having been to unit last week  for family education and d/c planning. Re-oriented throughout session with no carryover.   Therapy Documentation Precautions:  Precautions Precautions: Cervical, Fall(no brace required) Precaution Booklet Issued: No Restrictions Weight Bearing Restrictions: No   Therapy/Group: Individual Therapy  Dewane Timson L 07/14/2018, 7:05 AM
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
## 2018-07-14 NOTE — Progress Notes (Signed)
Physical Therapy Session Note  Patient Details  Name: Curtis Bowen MRN: [id number] Date of Birth: 1960/05/22  Today's Date: 07/14/2018 PT Individual Time: 1330-1430 PT Individual Time Calculation (min): 60 min   Short Term Goals: Week 2:  PT Short Term Goal 1 (Week 2): =LTG due to ELOS  Skilled Therapeutic Interventions/Progress Updates:    Pt received seated in bed, agreeable to PT session with encouragement. Pt oriented to place but not time or situation, reoriented pt throughout session. Pt reports urge to have a bowel movement. Rolling L/R with min A and use of bedrails for depending doffing of pants and brief and placement of bedpan. Pt is able to have a smear of bowel while on bedpan, see flowsheet for details. Rolling L/R with min A and use of bedrails for dependent pericare, donning of new brief, donning of pants. Once pt has completed toileting he reports urge to have another bowel movement and that he has already had one in his brief. Rolling L/R with min A and use of bedrails to check brief, pt has a clean brief. Pt perseverates on feeling like he is having "deja vu". Pt also perseverates on numbness in fingers and BLE throughout session. Pt left semi-reclined in bed setup for lunch with needs in reach, bed alarm in place, telesitter present at end of session.  Therapy Documentation Precautions:  Precautions Precautions: Cervical, Fall(no brace required) Precaution Booklet Issued: No Restrictions Weight Bearing Restrictions: No    Therapy/Group: Individual Therapy   Excell Seltzer, PT, DPT  07/14/2018, 2:34 PM

## 2018-07-15 ENCOUNTER — Inpatient Hospital Stay (HOSPITAL_COMMUNITY): Payer: Self-pay

## 2018-07-15 ENCOUNTER — Inpatient Hospital Stay (HOSPITAL_COMMUNITY): Payer: Self-pay | Admitting: Physical Therapy

## 2018-07-15 ENCOUNTER — Inpatient Hospital Stay (HOSPITAL_COMMUNITY): Payer: Self-pay | Admitting: Occupational Therapy

## 2018-07-15 MED ORDER — BISACODYL 10 MG RE SUPP
10.0000 mg | Freq: Every day | RECTAL | Status: DC | PRN
  Administered 2018-07-15: 10 mg via RECTAL
  Filled 2018-07-15: qty 1

## 2018-07-15 MED ORDER — CHLORDIAZEPOXIDE HCL 5 MG PO CAPS
5.0000 mg | ORAL_CAPSULE | Freq: Every day | ORAL | Status: AC
  Administered 2018-07-16: 5 mg via ORAL
  Filled 2018-07-15: qty 1

## 2018-07-15 MED ORDER — ENSURE ENLIVE PO LIQD
237.0000 mL | Freq: Two times a day (BID) | ORAL | Status: DC
  Administered 2018-07-15: 237 mL via ORAL

## 2018-07-15 NOTE — Progress Notes (Signed)
Initial Nutrition Assessment  RD working remotely.  DOCUMENTATION CODES:   Underweight, suspect pt with some degree of malnutrition but unable to confirm at this time without NFPE  INTERVENTION:   - Please obtain new weight  - Continue MVI with minerals daily  - Magic cup TID with meals, each supplement provides 290 kcal and 9 grams of protein  - Ensure Enlive po BID, each supplement provides 350 kcal and 20 grams of protein  NUTRITION DIAGNOSIS:   Increased nutrient needs related to other (therapies) as evidenced by estimated needs.  GOAL:   Patient will meet greater than or equal to 90% of their needs  MONITOR:   PO intake, Supplement acceptance, Labs, I & O's, Weight trends, Skin  REASON FOR ASSESSMENT:   Low Braden    ASSESSMENT:   58 year old male with PMH of EtOH abuse and tobacco use. Pt presented 06/21/18 with progressive lower extremity weakness and gait instability. CT of the head showed no acute intracranial abnormalities. MRI completed demonstrating significant cervical stenosis at C5-6 with spinal cord compression. Pt underwent discectomy C5-6 for decompression and arthrodesis anterior interbody technique on 06/26/18. Tolerating a regular diet. Pt admitted to CIR on 6/23.  Per MD, there is a cognitive components to pt's reduced PO intake. Megace trial was initiated on 6/25.  Reviewed therapy team notes. Pt disoriented to place, situation, and time. RD will defer phone call to room at this time.  Given variable PO intake, RD will provide Magic Cup and Ensure Enlive to aid pt in meeting kcal and protein needs.  No new weight available since 6/24. Recommend obtaining a new weight.  Meal Completion: 25-100% x last 8 recorded meals  Medications reviewed and include: folic acid, Megace 299 mg BID, MVI with minerals daily, thiamine, IV abx  Labs reviewed.  NUTRITION - FOCUSED PHYSICAL EXAM:  Unable to complete at this time. RD working remotely.  Diet  Order:   Diet Order            Diet regular Room service appropriate? No; Fluid consistency: Thin  Diet effective now              EDUCATION NEEDS:   Not appropriate for education at this time  Skin:  Skin Assessment:  Skin Integrity Issues: Unstageable: right buttocks Incisions: closed incision to neck  Last BM:  07/14/18 smear  Height:   Ht Readings from Last 1 Encounters:  07/04/18 6' (1.829 m)    Weight:   Wt Readings from Last 1 Encounters:  07/02/18 60.2 kg    Ideal Body Weight:  80.9 kg  BMI:  Body mass index is 18 kg/m.  Estimated Nutritional Needs:   Kcal:  1800-2000  Protein:  80-95 grams  Fluid:  >/= 1.8 L    Gaynell Face, MS, RD, LDN Inpatient Clinical Dietitian Pager: [phone] Weekend/After Hours: [phone]

## 2018-07-15 NOTE — Progress Notes (Signed)
Occupational Therapy Session Note  Patient Details  Name: Curtis Bowen MRN: [id number] Date of Birth: 12/01/1960  Today's Date: 07/15/2018 OT Individual Time:0815-0828 1300-1330  OT Individual Time Calculation (min): 13 min and  30 min    Short Term Goals: Week 2:  OT Short Term Goal 1 (Week 2): STG=LTG due to LOS  Skilled Therapeutic Interventions/Progress Updates:    Session One: Therapist arrived for session with pt awake and alert. He was agreeable to bathing/dressing from bed level this session. He was oriented to [employer]" and "rehab center", though did not know why he was in rehab. He was also oriented to month and year. He retrieved items from duffle bag with min cuing for recalling items in which he had already retrieved.  Voiced need for potential BM, refused to transfer to [facility] for toileting needs, insistent on using bed pan. Upon rolling to remove brief and place bad pan, pt's brief noted to be totally saturated with large BM smear, pt without awareness to incontinent episode. Brief removed and hygiene performed. Pt voiced still feeling urge to have BM and still to place on bed pan and for therapist to return at later time.  Pt placed on bed pan and left with all needs in reach, NT made aware of pt's position.   Therapist returned at 9:20, pt off bedpan, however, set-up with breakfast tray and refusing therapy at this time. Despite encouragement and education pt cont to refuse. Will attempt to make up time as pt willing.   Session Two: Therapist returned, and pt initially refusing therapy due to "having had a bad batch of rhubarb", perseverating on constipation. With lots of encouragement and education, pt willing to get to [facility] to attempt BM.  Pants donned total A at bed level prior to sliding board transfer.  He transferred to sitting EOB with min A using hospital bed functions. Completed min A sliding board transfer to drop arm BSC with max multi-modal cuing for  sequencing/technique and management of B LEs. Total A +2 for clothing management, pt with minimaly effective lateral leans needed in order to have pants pulled down.  Pt positioned with back support and foot stool for comfort. Pt left seated on BSC at end of session with NT present. Instructions provided to NT regarding how to assist pt when toileting task completed.  Pt not oriented to city or situation this session. He did know he was on rehab unit, however, thought it was because he had constipation.   Therapy Documentation Precautions:  Precautions Precautions: Cervical, Fall(no brace required) Precaution Booklet Issued: No Restrictions Weight Bearing Restrictions: No   Therapy/Group: Individual Therapy  Natalyia Innes L 07/15/2018, 6:59 AM
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
## 2018-07-15 NOTE — Plan of Care (Signed)
  Problem: Consults Goal: RH SPINAL CORD INJURY PATIENT EDUCATION Description:  See Patient Education module for education specifics.  Outcome: Progressing   Problem: SCI BOWEL ELIMINATION Goal: RH STG MANAGE BOWEL WITH ASSISTANCE Description: STG Manage Bowel with mod.Assistance. Outcome: Progressing Goal: RH STG SCI MANAGE BOWEL WITH MEDICATION WITH ASSISTANCE Description: STG SCI Manage bowel with medication with mod.assistance. Outcome: Progressing   Problem: SCI BLADDER ELIMINATION Goal: RH STG MANAGE BLADDER WITH ASSISTANCE Description: STG Manage Bladder With mod.Assistance Outcome: Progressing   Problem: RH SKIN INTEGRITY Goal: RH STG SKIN FREE OF INFECTION/BREAKDOWN Description: With mod. assist Outcome: Progressing Goal: RH STG MAINTAIN SKIN INTEGRITY WITH ASSISTANCE Description: STG Maintain Skin Integrity With mod.Assistance. Outcome: Progressing Goal: RH STG ABLE TO PERFORM INCISION/WOUND CARE W/ASSISTANCE Description: STG Able To Perform Incision/Wound Care With mod.Assistance. Outcome: Progressing   Problem: RH SAFETY Goal: RH STG ADHERE TO SAFETY PRECAUTIONS W/ASSISTANCE/DEVICE Description: STG Adhere to Safety Precautions With mod.Assistance/Device. Outcome: Progressing   Problem: RH PAIN MANAGEMENT Goal: RH STG PAIN MANAGED AT OR BELOW PT'S PAIN GOAL Description: Less than 3,on 1 to 10 Outcome: Progressing   Problem: RH KNOWLEDGE DEFICIT SCI Goal: RH STG INCREASE KNOWLEDGE OF SELF CARE AFTER SCI Description: Pt. And family able to verbalized safety precautions before discharge Outcome: Progressing   

## 2018-07-15 NOTE — Progress Notes (Signed)
Occupational Therapy Session Note  Patient Details  Name: Curtis Bowen MRN: [id number] Date of Birth: [birth year]  Today's Date: 07/15/2018 OT Individual Time: 1132-1200 OT Individual Time Calculation (min): 28 min    Short Term Goals: Week 2:  OT Short Term Goal 1 (Week 2): STG=LTG due to LOS  Skilled Therapeutic Interventions/Progress Updates:    Patient in long sit position in bed continues to eat breakfast at 11:30.  Max cues to manage bed repositioning controls, mod A to move up in bed.  Oriented to self only. Pleasant but refuses to complete bathing/dressing tasks or transfer OOB.  Confabulates recent past stating "I just got back here last night", [place] etc.  Completed orientation with poor recall/carryover.  He remained in bed at close of session.  Therapy Documentation Precautions:  Precautions Precautions: Cervical, Fall(no brace required) Precaution Booklet Issued: No Restrictions Weight Bearing Restrictions: No General:   Vital Signs:  Pain: Pain Assessment Pain Scale: 0-10 Pain Score: 0-No pain   Other Treatments:     Therapy/Group: Individual Therapy  Carlos Levering 07/15/2018, 12:16 PM

## 2018-07-15 NOTE — Progress Notes (Signed)
Physical Therapy Session Note  Patient Details  Name: Curtis Bowen MRN: [id number] Date of Birth: [birth year]  Today's Date: 07/15/2018 PT Individual Time: 1430-1530 PT Individual Time Calculation (min): 60 min   Short Term Goals: Week 2:  PT Short Term Goal 1 (Week 2): =LTG due to ELOS  Skilled Therapeutic Interventions/Progress Updates:    Pt received seated in bed, reports he feels like he was "hit by a truck", unable to be more specific with pain site or symptoms. Pt reports urge to have a bowel movement. Supine to sit with min A with HOB elevated. Slide board transfer bed to bedside commode with mod A. Pt frequently cries out with discomfort while seated on commode due to pain in LE while seated. Provided 4" step under BLE for improved support in sitting. Pt is able to perform lateral leans while seated on commode chair for dependent pericare. Attempt to place slide board for transfer back to bed and pt crying out in pain and unable to tolerate slide board placement. Total A x 2 for squat pivot transfer back to bed. Sit to supine min A. Rolling L/R with min A and use of bedrails for dependent donning of brief. Pt left semi-reclined in bed with needs in reach, bed alarm in place, telesitter present.  Therapy Documentation Precautions:  Precautions Precautions: Cervical, Fall(no brace required) Precaution Booklet Issued: No Restrictions Weight Bearing Restrictions: No   Therapy/Group: Individual Therapy   Excell Seltzer, PT, DPT  07/15/2018, 3:50 PM

## 2018-07-15 NOTE — Progress Notes (Signed)
Speech Language Pathology Daily Session Note  Patient Details  Name: Curtis Bowen MRN: [id number] Date of Birth: 05-18-1960  Today's Date: 07/15/2018 SLP Individual Time: 7782-4235 SLP Individual Time Calculation (min): 42 min  Short Term Goals: Week 2: SLP Short Term Goal 1 (Week 2): With Max A cues, pt will sustain attention to task for 3 minutes. SLP Short Term Goal 2 (Week 2): With Max A cues, pt will solve basic problem solving tasks.  Skilled Therapeutic Interventions: Skilled ST services focused on cognitive skills. Pt was on the phone with wife and consuming breakfast upon entering. Pt requested to finish breakfast and refused to complete planned cognitive test until after consumption of breakfast tray. Pt requested to use the bathroom for BM, SLP assisted in bed ambulation and placed bed pan, pt followed 1 step commands. Pt demonstrated ability to problem solve in repositioning in bed and utilization of bed remote with max A verbal cues. Pt utilized urinal with mod A verbal cues. Pt demonstrated sustained attention during ADLs in 2 minute intervals and max A verbal cues for redirection in 5 minute intervals. Pt was disorientated to place, situation and time, however given yes/no questions was orientated to place and situation. Pt was left in room with call bell within reach and bed alarm set. ST recommends to continue skilled ST services.      Pain Pain Assessment Pain Scale: 0-10 Pain Score: 0-No pain  Therapy/Group: Individual Therapy  Chauntay Paszkiewicz  [facility] 07/15/2018, 11:14 AM
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
## 2018-07-15 NOTE — Progress Notes (Signed)
  Patient ID: Curtis Bowen, male   DOB: December 04, 1960, 58 y.o.   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  A rolling walker will not resolve issue with performing activities of daily living.  A wheelchair will allow patient to safely perform daily activities.  Patient is not able to propel themselves in the home using a standard weight wheelchair due to upper extremity weakness.  Patient can self propel in the lightweight wheelchair.   Lauraine Rinne, PA-C

## 2018-07-16 ENCOUNTER — Encounter (HOSPITAL_COMMUNITY): Payer: Self-pay | Admitting: Occupational Therapy

## 2018-07-16 ENCOUNTER — Ambulatory Visit (HOSPITAL_COMMUNITY): Payer: Self-pay | Admitting: Physical Therapy

## 2018-07-16 MED ORDER — NICOTINE 21 MG/24HR TD PT24: MEDICATED_PATCH | TRANSDERMAL | 0 refills | Status: AC

## 2018-07-16 MED ORDER — FOLIC ACID 1 MG PO TABS: 1.0000 mg | ORAL_TABLET | Freq: Every day | ORAL | 0 refills | Status: AC

## 2018-07-16 MED ORDER — METOPROLOL TARTRATE 25 MG PO TABS: 25.0000 mg | ORAL_TABLET | Freq: Two times a day (BID) | ORAL | 1 refills | Status: AC

## 2018-07-16 MED ORDER — QUETIAPINE FUMARATE 100 MG PO TABS: 100.0000 mg | ORAL_TABLET | Freq: Every day | ORAL | 0 refills | Status: AC

## 2018-07-16 MED ORDER — THIAMINE HCL 100 MG PO TABS: 100.0000 mg | ORAL_TABLET | Freq: Every day | ORAL | 0 refills | Status: AC

## 2018-07-16 MED ORDER — CHLORDIAZEPOXIDE HCL 5 MG PO CAPS: 5.0000 mg | ORAL_CAPSULE | Freq: Every day | ORAL | 0 refills | Status: AC

## 2018-07-16 MED ORDER — HYDRALAZINE HCL 25 MG PO TABS: 25.0000 mg | ORAL_TABLET | Freq: Four times a day (QID) | ORAL | 0 refills | Status: AC

## 2018-07-16 NOTE — Progress Notes (Signed)
Physical Therapy Session Note  Patient Details  Name: Curtis Bowen MRN: [id number] Date of Birth: 13-Oct-1960  Today's Date: 07/16/2018 PT Individual Time: 0900-0950 PT Individual Time Calculation (min): 50 min   Short Term Goals: Week 1:  PT Short Term Goal 1 (Week 1): Pt will complete least restrictive transfer with mod A consistently PT Short Term Goal 1 - Progress (Week 1): Met PT Short Term Goal 2 (Week 1): Pt will initiate w/c mobility PT Short Term Goal 2 - Progress (Week 1): Met PT Short Term Goal 3 (Week 1): Pt will recall 2/3 cervical precautions PT Short Term Goal 3 - Progress (Week 1): Not met Week 2:  PT Short Term Goal 1 (Week 2): =LTG due to ELOS  Skilled Therapeutic Interventions/Progress Updates:   Pt received supine in bed and agreeable to PT. Wife present for family education. Supine>sit transfer with min-mod assist from wife through long sitting. Pt required assist to bring BLE to EOB due to hip weakness. Reciprocal scooting to EOB with min assist and increased time/ SB transfer to [facility] with min assist from Wife. Pt and wife educated on home access via stairs in [facility] with hand out provided and attempted x 2. Wife unable to life pt over first step. Car transfer x 2 with min assist form PT and mod assit from wife with moderate cues for set up , safety, and technique. Patient returned to room and left sitting in [facility] with call bell in reach and all needs met.        Therapy Documentation Precautions:  Precautions Precautions: Cervical, Fall Precaution Booklet Issued: No Precaution Comments: Pt unable to recall or maintain pre-cautions due to cognitive deficits Restrictions Weight Bearing Restrictions: No    Pain: Pain Assessment Pain Scale: Faces Faces Pain Scale: No hurt  Trunk/Postural Assessment : Cervical Assessment Cervical Assessment: Exceptions to WFL(Cervical pre-cautions) Thoracic Assessment Thoracic Assessment: Exceptions to WFL(Kyphotic; Rounded  shoulders) Lumbar Assessment Lumbar Assessment: Exceptions to WFL(Posterior pelvic tilt) Postural Control Postural Control: Deficits on evaluation Righting Reactions: delayed Protective Responses: delayed Postural Limitations: impaired  Balance: Balance Balance Assessed: Yes Static Sitting Balance Static Sitting - Balance Support: No upper extremity supported;Feet supported Static Sitting - Level of Assistance: 5: Stand by assistance Static Sitting - Comment/# of Minutes: Sitting EOB Dynamic Sitting Balance Dynamic Sitting - Balance Support: No upper extremity supported;Feet supported;During functional activity Dynamic Sitting - Level of Assistance: 5: Stand by assistance;4: Min assist    Therapy/Group: Individual Therapy  Lorie Phenix 07/16/2018, 9:52 AM

## 2018-07-16 NOTE — Progress Notes (Signed)
Social Work Discharge Note   The overall goal for the admission was met for:   Discharge location: Yes - home with wife to provide 24/7 assistance  Length of Stay: Yes - 15 days  Discharge activity level: Yes - minimal assistance overall  Home/community participation: Yes  Services provided included: MD, RD, PT, OT, SLP, RN, Pharmacy and [place]: Medicare  Follow-up services arranged: Home Health: PT, OT via [facility], DME: 16x16 lightweight w/c, cushion, drop arm commode and 30" transfer board via [place] and Patient/Family has no preference for HH/DME agencies  Comments (or additional information):    Contact info:  Wife, Cailean Heacock @ [phone]  Patient/Family verbalized understanding of follow-up arrangements: Yes  Individual responsible for coordination of the follow-up plan: wife  Confirmed correct DME delivered: Lennart Pall 07/16/2018    Dayla Gasca, Lorre Nick

## 2018-07-16 NOTE — Plan of Care (Signed)
  Problem: Consults Goal: RH SPINAL CORD INJURY PATIENT EDUCATION Description:  See Patient Education module for education specifics.  Outcome: Progressing   Problem: SCI BOWEL ELIMINATION Goal: RH STG MANAGE BOWEL WITH ASSISTANCE Description: STG Manage Bowel with mod.Assistance. Outcome: Progressing Goal: RH STG SCI MANAGE BOWEL WITH MEDICATION WITH ASSISTANCE Description: STG SCI Manage bowel with medication with mod.assistance. Outcome: Progressing   Problem: SCI BLADDER ELIMINATION Goal: RH STG MANAGE BLADDER WITH ASSISTANCE Description: STG Manage Bladder With mod.Assistance Outcome: Progressing   Problem: RH SKIN INTEGRITY Goal: RH STG SKIN FREE OF INFECTION/BREAKDOWN Description: With mod. assist Outcome: Progressing Goal: RH STG MAINTAIN SKIN INTEGRITY WITH ASSISTANCE Description: STG Maintain Skin Integrity With mod.Assistance. Outcome: Progressing Goal: RH STG ABLE TO PERFORM INCISION/WOUND CARE W/ASSISTANCE Description: STG Able To Perform Incision/Wound Care With mod.Assistance. Outcome: Progressing   Problem: RH SAFETY Goal: RH STG ADHERE TO SAFETY PRECAUTIONS W/ASSISTANCE/DEVICE Description: STG Adhere to Safety Precautions With mod.Assistance/Device. Outcome: Progressing   Problem: RH PAIN MANAGEMENT Goal: RH STG PAIN MANAGED AT OR BELOW PT'S PAIN GOAL Description: Less than 3,on 1 to 10 Outcome: Progressing   Problem: RH KNOWLEDGE DEFICIT SCI Goal: RH STG INCREASE KNOWLEDGE OF SELF CARE AFTER SCI Description: Pt. And family able to verbalized safety precautions before discharge Outcome: Progressing
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
## 2018-07-16 NOTE — Progress Notes (Signed)
Pt discharge with family. Belongings and equipment sent with pt. discharge instructions given by Linna Hoff, [place]. No further questions from pt or family. Pt stable at time of DC.   Gerald Stabs, RN

## 2018-07-16 NOTE — Progress Notes (Signed)
Speech Language Pathology Discharge Summary  Patient Details  Name: Curtis Bowen MRN: [id number] Date of Birth: 1960-11-13  Today's Date: 07/16/2018 SLP Individual Time:  -     Patient has met 5 of 5 long term goals.  Patient to discharge at overall Max level.  Reasons goals not met:     Clinical Impression/Discharge Summary:   Pt met 5 out 5 goals discharging at maximum assist. Per chart review in communication with pt's wife, pt had baseline deficits impacting sustained attention, awareness and basic problem solving skills, these deficits are further impacted by acute surgery. Pt appeared to be near baseline and education has been completed with pt's wife via phone. Pt would benefit from skilled ST services in order to maximize functional independence and reduce burden of care, requiring 24 hour supervision and continue ST services.  Care Partner:  Caregiver Able to Provide Assistance: Yes  Type of Caregiver Assistance: Physical;Cognitive  Recommendation:  24 hour supervision/assistance;Home Health SLP  Rationale for SLP Follow Up: Maximize cognitive function and independence;Reduce caregiver burden   Equipment: N/A   Reasons for discharge: Discharged from hospital   Patient/Family Agrees with Progress Made and Goals Achieved: Yes    [name]  [facility] 07/16/2018, 12:34 PM

## 2018-07-16 NOTE — Discharge Instructions (Signed)
Inpatient Rehab Discharge Instructions  Curtis Bowen Discharge date and time: No discharge date for patient encounter.   Activities/Precautions/ Functional Status: Activity: activity as tolerated Diet: regular diet Wound Care: keep wound clean and dry Functional status:  ___ No restrictions     ___ Walk up steps independently ___ 24/7 supervision/assistance   ___ Walk up steps with assistance ___ Intermittent supervision/assistance  ___ Bathe/dress independently ___ Walk with walker     __x_ Bathe/dress with assistance ___ Walk Independently    ___ Shower independently ___ Walk with assistance    ___ Shower with assistance ___ No alcohol     ___ Return to work/school ________    COMMUNITY REFERRALS UPON DISCHARGE:    Home Health:   PT     OT                     Agency:  [facility]    Phone: [phone]   Medical Equipment/Items Ordered: wheelchair, cushion, drop arm commode and transfer board                                                     Agency/Supplier:  [place] @ [phone]      Special Instructions: No driving smoking alcohol or illicit drug products   My questions have been answered and I understand these instructions. I will adhere to these goals and the provided educational materials after my discharge from the hospital.  Patient/Caregiver Signature _______________________________ Date __________  Clinician Signature _______________________________________ Date __________  Please bring this form and your medication list with you to all your follow-up doctor's appointments.

## 2018-07-16 NOTE — Progress Notes (Signed)
Occupational Therapy Session Note  Patient Details  Name: Curtis Bowen MRN: [id number] Date of Birth: 15-Aug-1960  Today's Date: 07/16/2018 OT Individual Time: 1000-1035 OT Individual Time Calculation (min): 35 min    Short Term Goals: Week 2:  OT Short Term Goal 1 (Week 2): STG=LTG due to LOS  Skilled Therapeutic Interventions/Progress Updates:    Pt seen for OT family education session. Pt sitting up in w/c eating breakfast upon arrival, agreeable to tx session and denying pain. Pt's wife report hands on training with PT went well. 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  With encouragement, willing to complete transfer to drop arm BSC. She required min VCs for proper set-up of equipment prior to transfer. She was initially not going to use sliding board for transfer. Provided education and reason for using sliding board including reducing caregiver burden, importance of proper body mechanics, and reducing fall risk. Mod A overall for transfer to [facility]. They declined practicing clothing management on [facility], education provided regarding techniques of how to manage clothing and hygiene. Completed sliding board transfer back to w/c.   Poor board placement and management of LEs pt sliding forward off sliding board/BSC. Required total A +2 to safely position back in w/c. They declined wanting to practice another transfer, reports feeling comfortable with all aspects of going home. Following rest break, completed sliding board transfer to new personal w/c. Pt left seated in new w/c, set-up with breakfast tray and wife present.  Education provided regarding pt's CLOF, physical and cognitive deficits and functional implications, strict w/c level only, DME, continuum of care, and d/c planning. Bathing/dressing routine covered in previous family ed session.   Therapy Documentation Precautions:  Precautions Precautions: Cervical,  Fall Precaution Booklet Issued: No Restrictions Weight Bearing Restrictions: No    Therapy/Group: Individual Therapy  Dustina Scoggin L 07/16/2018, 6:50 AM

## 2018-07-16 NOTE — Patient Care Conference (Signed)
Inpatient RehabilitationTeam Conference and Plan of Care Update Date: 07/15/2018   Time: 10:05 AM    Patient Name: Curtis Bowen      Medical Record Number: [id number]  Date of Birth: 02/05/1960 Sex: Male         Room/Bed: 4W08C/4W08C-01 Payor Info: Payor: MEDICARE / Plan: MEDICARE PART A / Product Type: *No Product type* /    Admitting Diagnosis: 1. SCI Team  Other NTSC dysf, Cervical myelopathy; 20-21 days  Admit Date/Time:  07/01/2018  5:09 PM Admission Comments: No comment available   Primary Diagnosis:  <principal problem not specified> Principal Problem: <principal problem not specified>  Patient Active Problem List   Diagnosis Date Noted  . Labile blood pressure   . Poor nutrition   . Acute on chronic anemia   . Acute lower UTI   . Essential hypertension   . ETOH abuse   . Encephalopathy   . Cervical myelopathy (HCC) 07/01/2018  . Cervical spinal stenosis 06/21/2018  . Alcohol use   . Hyponatremia   . Tobacco use   . Wheezing     Expected Discharge Date: Expected Discharge Date: 07/16/18  Team Members Present: Physician leading conference: Dr. Maryla MorrowAnkit Patel Social Worker Present: Amada JupiterLucy Merton Wadlow, LCSW Nurse Present: Otilio CarpenMekides Nida, RN PT Present: Peter Congoaylor [place], PT OT Present: Amy Rounds, OT SLP Present: Colin BentonMadison Cratch, SLP PPS Coordinator present : Fae PippinMelissa Bowie, SLP     Current Status/Progress Goal Weekly Team Focus  Medical             Bowel/Bladder   Pt is continent of B/B w/ increased amounts of incontinent episodes. pt c/o of constipation.  LBM: 07/14/18  Remain continent with normal bowel pattern.  Offer Q2h toileting, encouraging pt to use urinal.   Swallow/Nutrition/ Hydration             ADL's   Min-mod A slide board transfers; mod A LB bathing/dressing from bed level, supervision/set-up UB bating/dressing; total A toileting  Downgraded to min A overall  ADL re-training, functional transfers, d/c planning, family education   Mobility   min A med  mobility, CGA to min A SB transfers, Supervision w/c mobility  min A overall  transfers, attention to therapy tasks, family training prior to d/c home   Communication             Safety/Cognition/ Behavioral Observations  Total to Max A  Max A cues  sustained attention and basic problem solving   Pain   pt c/o neuropathic pain in both hands, and legs. Pt aso c/o of lower back pain.  Pt pain level <3  Assess pain Qshift/PRN, medicate and reposition Q2h to help with lower back pain.   Skin   Pt has small skin tear to LLE on the posterior side, foam dressing in place.  Prevent further breakdown of skin,  Assess skin Qshift/ PRN, address needs as needed.    Rehab Goals Patient on target to meet rehab goals: No Rehab Goals Revised: goals may need to be downgraded due to pt not albe to focus or carry over information. *See Care Plan and progress notes for long and short-term goals.     Barriers to Discharge  Current Status/Progress Possible Resolutions Date Resolved   Physician                    Nursing                  PT  Behavior  OT                  SLP                SW                Discharge Planning/Teaching Needs:  Pt to d/c home with wife who will provide 24/7 assist  Wife completing fam ed tomorrow morning prior to d/c.   Team Discussion:  No significant improvement with librium;  BP up and down.  abx for UTI;  Cont/ incont and poor po intake.  Very slow to eat but, per wife, this was habit PTA.  ADLs at bed level min/ mod assist (goals downgraded).  Min bed mobility and slide board tfs. ST downgraded goals to max assist. No carryover and extremely distracted.  Revisions to Treatment Plan:  Most goals downgraded.        I attest that I was present, lead the team conference, and concur with the assessment and plan of the team.   Lennart Pall 07/16/2018, 3:44 PM    Team conference was held via web/ teleconference due to [place] - 19

## 2018-07-24 ENCOUNTER — Telehealth: Payer: Self-pay

## 2018-07-24 NOTE — Telephone Encounter (Addendum)
Izora Gala OT [facility] called and requested verbal orders for OT for 1xwk X 8wks.  Called her back and approved verbal orders.  In addition, she has requested prescriptions for a light wheelchair along with wheelchair workup, and also a roho cushion.  She asked if the prescriptions can be faxed to [phone].  Izora Gala called back and recieved the correct fax number, number now is correct.

## 2018-07-24 NOTE — Telephone Encounter (Signed)
HH is ok. He will need FTF for custom wheelchair, cushion, etc

## 2018-07-25 NOTE — Telephone Encounter (Signed)
I left a message with Curtis Bowen OT to call us back to let us know if they need the FTF.

## 2018-07-25 NOTE — Telephone Encounter (Signed)
Curtis Bowen called back and they will just touch base next week with the patient and then let us know about the wheelchair and if FTF needed.

## 2018-07-25 NOTE — Telephone Encounter (Signed)
Sybil with [facility] doing it, I think they are needing the prescription for the wheelchair.  Remember Jackelyn Poling works with [employer].  That is 2 different [place].  I will assume that [facility] will do the wheelchair eval.

## 2018-07-30 ENCOUNTER — Telehealth: Payer: Self-pay

## 2018-07-30 DIAGNOSIS — Z981 Arthrodesis status: Secondary | ICD-10-CM

## 2018-07-30 DIAGNOSIS — G629 Polyneuropathy, unspecified: Secondary | ICD-10-CM

## 2018-07-30 DIAGNOSIS — M4802 Spinal stenosis, cervical region: Secondary | ICD-10-CM

## 2018-07-30 DIAGNOSIS — Z4789 Encounter for other orthopedic aftercare: Secondary | ICD-10-CM

## 2018-07-30 DIAGNOSIS — M50022 Cervical disc disorder at C5-C6 level with myelopathy: Secondary | ICD-10-CM

## 2018-07-30 DIAGNOSIS — F17210 Nicotine dependence, cigarettes, uncomplicated: Secondary | ICD-10-CM

## 2018-07-30 MED ORDER — CIPROFLOXACIN HCL 250 MG PO TABS: 250.0000 mg | ORAL_TABLET | Freq: Two times a day (BID) | ORAL | 0 refills | Status: AC

## 2018-07-30 NOTE — Telephone Encounter (Signed)
Curtis Bowen, PT from [facility] called to report patient vital signs yesterday. T: 101.2 BP: elevated P:100 R:20. Asymptomatic, chills that am.

## 2018-07-30 NOTE — Telephone Encounter (Signed)
Patient and Curtis Bowen has been notified to get prescription. Curtis Bowen states its hard for him to stand and it takes 2 people. A RN from [facility] can go out to get the urinalysis from him, need orders. Called an spoke to [place] from [facility] she states they will get a nurse out to collect the urine and take to the lab.

## 2018-07-30 NOTE — Telephone Encounter (Signed)
Pt needs urinalysis and culture. Begin cipro 250mg  bid x 7 days. rx sent to [facility]. If temp goes up any further he needs to go to urgent care or primary care

## 2018-08-11 ENCOUNTER — Telehealth: Payer: Self-pay

## 2018-08-11 NOTE — Telephone Encounter (Signed)
Moshe Salisbury RN [facility] nurse called to request verbal orders to cancel a UA order for this patient.  Stated that he was unable to provide a sample today and since last specimen was negative on 08-02-2018.  Gave her the verbal order to cancel the specimen based on current information.

## 2018-08-20 ENCOUNTER — Encounter: Payer: Self-pay | Attending: Physical Medicine & Rehabilitation | Admitting: Physical Medicine & Rehabilitation

## 2018-09-16 ENCOUNTER — Telehealth: Payer: Self-pay

## 2018-09-16 NOTE — Telephone Encounter (Signed)
Mark PT [facility] called requesting verbal orders for 2wk 8.  Called him back and approved verbal orders.

## 2018-09-22 ENCOUNTER — Telehealth: Payer: Self-pay

## 2018-09-22 NOTE — Telephone Encounter (Signed)
Curtis Bowen, OT from [facility] called requesting extension for [facility] 1wk8. Patient no showed for Hospital f/u.  Called patient to see if will schedule an appt no answer. Are you willing to sign off on order?

## 2018-09-23 NOTE — Telephone Encounter (Signed)
Called and relayed information to [place]

## 2018-09-23 NOTE — Telephone Encounter (Signed)
Will sign off this one time

## 2018-10-01 DIAGNOSIS — G629 Polyneuropathy, unspecified: Secondary | ICD-10-CM

## 2018-10-01 DIAGNOSIS — M50022 Cervical disc disorder at C5-C6 level with myelopathy: Secondary | ICD-10-CM

## 2018-10-01 DIAGNOSIS — Z4789 Encounter for other orthopedic aftercare: Secondary | ICD-10-CM

## 2018-10-01 DIAGNOSIS — F17210 Nicotine dependence, cigarettes, uncomplicated: Secondary | ICD-10-CM

## 2018-10-01 DIAGNOSIS — Z981 Arthrodesis status: Secondary | ICD-10-CM

## 2018-10-01 DIAGNOSIS — M4802 Spinal stenosis, cervical region: Secondary | ICD-10-CM

## 2018-10-29 DIAGNOSIS — M4802 Spinal stenosis, cervical region: Secondary | ICD-10-CM

## 2018-10-29 DIAGNOSIS — F17210 Nicotine dependence, cigarettes, uncomplicated: Secondary | ICD-10-CM

## 2018-10-29 DIAGNOSIS — Z4789 Encounter for other orthopedic aftercare: Secondary | ICD-10-CM

## 2018-10-29 DIAGNOSIS — G629 Polyneuropathy, unspecified: Secondary | ICD-10-CM

## 2018-10-29 DIAGNOSIS — Z981 Arthrodesis status: Secondary | ICD-10-CM

## 2018-10-29 DIAGNOSIS — M50022 Cervical disc disorder at C5-C6 level with myelopathy: Secondary | ICD-10-CM

## 2021-05-10 IMAGING — DX PORTABLE CHEST - 1 VIEW
1 series · 1 of 1 positions shown · non-contrast
Comparison: Portable exam 7577 hours without priors for comparison

CLINICAL DATA: Wheezing

EXAM:
PORTABLE CHEST 1 VIEW

[chest ap]
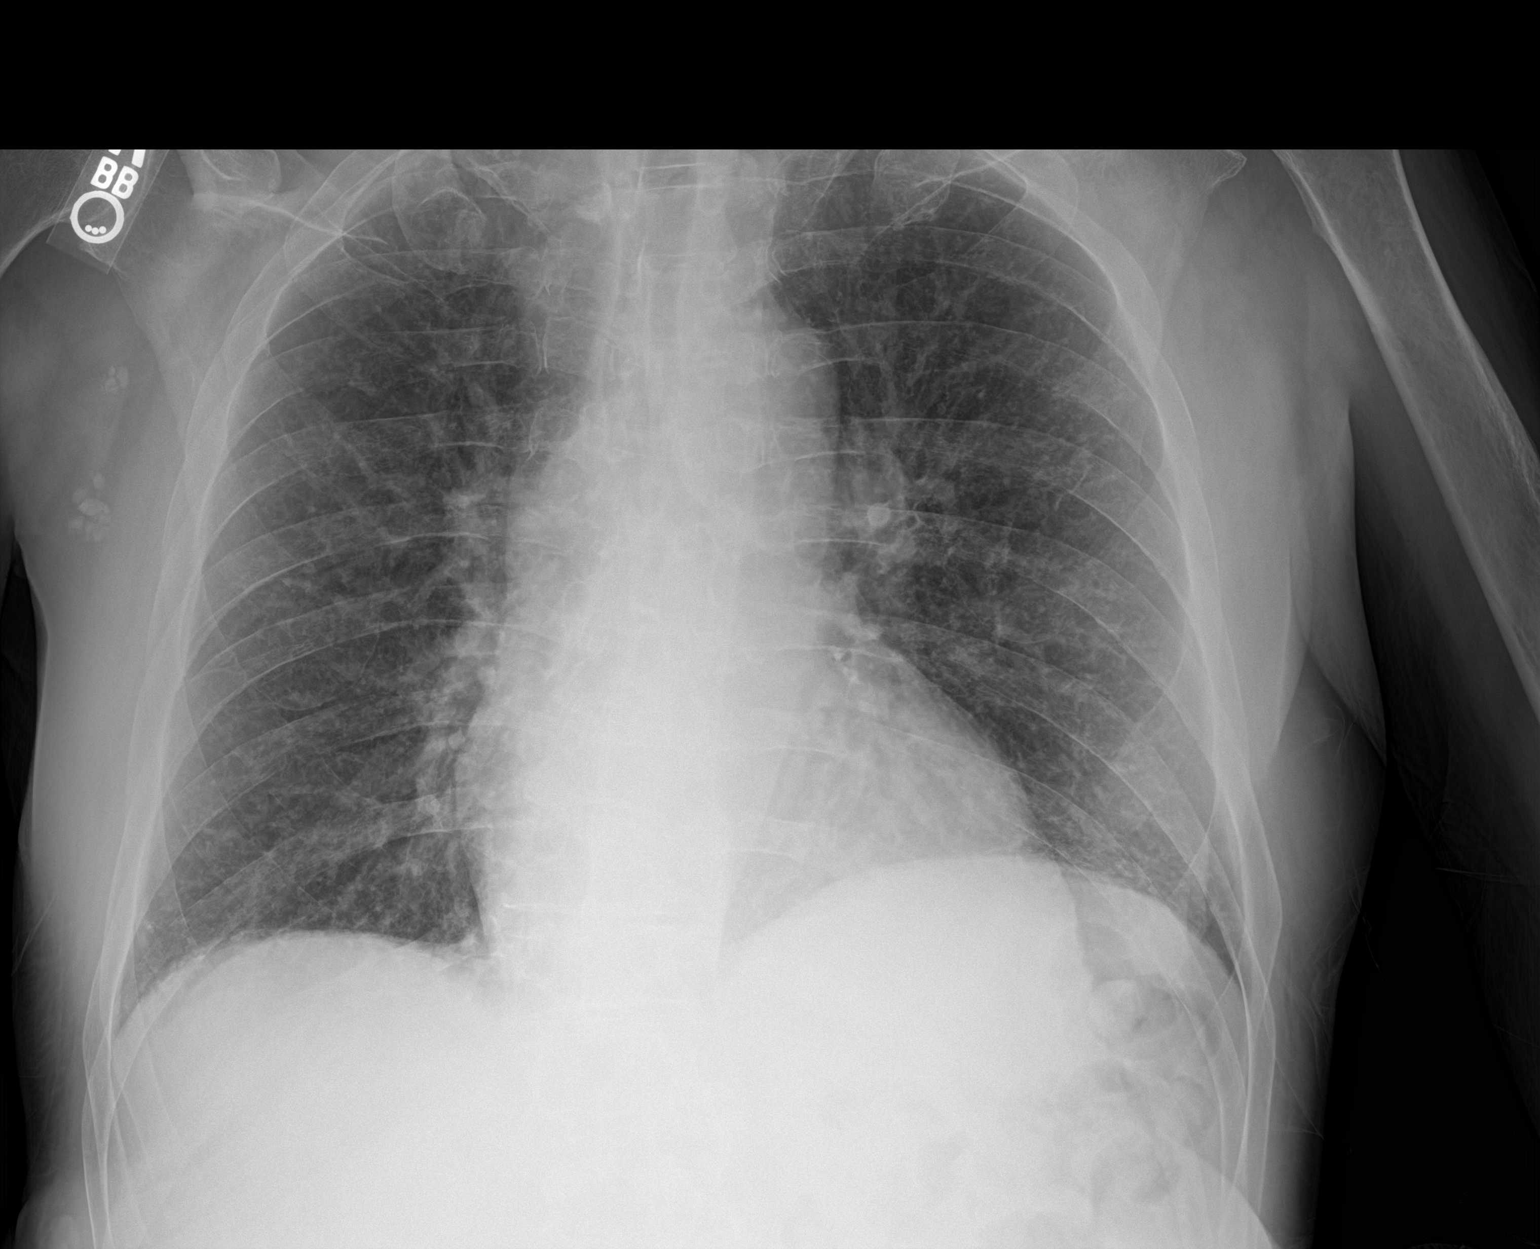

[1 of 1 positions shown; findings below may reference images not displayed]

FINDINGS: Upper normal heart size.

Mediastinal contours and pulmonary vascularity normal.

Peribronchial thickening centrally with mild LEFT basilar
atelectasis.

No acute infiltrate, pleural effusion or pneumothorax.

Bones demineralized.

Soft tissue calcifications at RIGHT axilla, nonspecific.
IMPRESSION: Bronchitic changes with mild LEFT basilar atelectasis.

## 2021-05-15 IMAGING — RF DG CERVICAL SPINE - 1 VIEW
1 series · 1 of 1 positions shown · non-contrast
Comparison: None.

CLINICAL DATA: ACDF C5-6

EXAM:
DG C-ARM 61-120 MIN; DG CERVICAL SPINE - 1 VIEW

[Series 1: run · 1 of 1 slices shown]
[im 1/1]
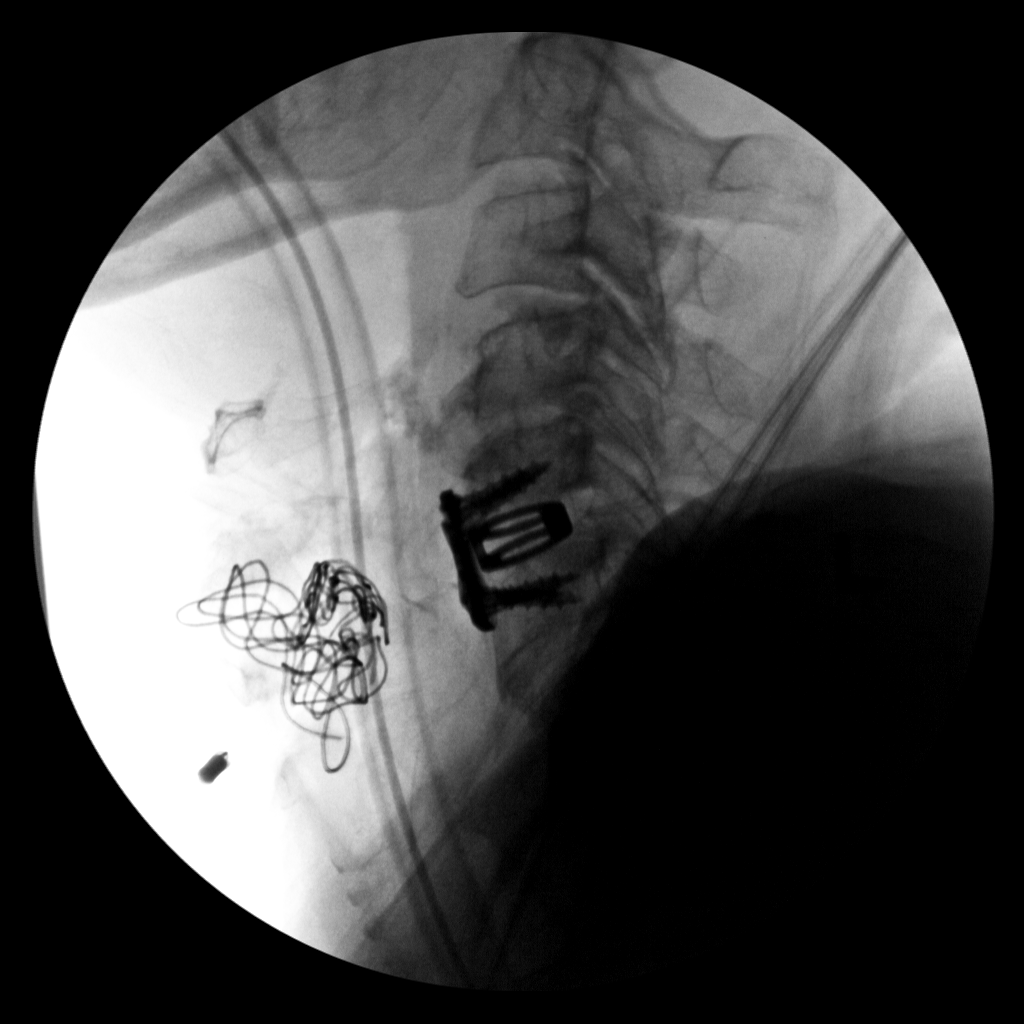

[1 of 1 positions shown; findings below may reference images not displayed]

FINDINGS: Single lateral view of the cervical spine is obtained in the
operating room. Patient is intubated. Surgical sponge in the
anterior soft tissues

ACDF at C5-6. Anterior plate and screws in good position. Interbody
spacer in good position.
IMPRESSION: ACDF C5-6.
